# Patient Record
Sex: Female | Born: 1935 | Race: White | Hispanic: No | Marital: Married | Smoking: Never smoker
Health system: Southern US, Community
[De-identification: ages and names within clinical notes are randomized; demographics above are authoritative.]

## PROBLEM LIST (undated history)

## (undated) DIAGNOSIS — G250 Essential tremor: Secondary | ICD-10-CM

## (undated) DIAGNOSIS — L570 Actinic keratosis: Secondary | ICD-10-CM

## (undated) DIAGNOSIS — E785 Hyperlipidemia, unspecified: Secondary | ICD-10-CM

## (undated) DIAGNOSIS — I10 Essential (primary) hypertension: Secondary | ICD-10-CM

## (undated) HISTORY — PX: TUBAL LIGATION: SHX77

## (undated) HISTORY — PX: TONSILLECTOMY: SUR1361

## (undated) HISTORY — PX: APPENDECTOMY: SHX54

## (undated) HISTORY — PX: COLONOSCOPY: SHX174

## (undated) HISTORY — DX: Actinic keratosis: L57.0

## (undated) HISTORY — PX: OTHER SURGICAL HISTORY: SHX169

---

## 2004-11-07 ENCOUNTER — Ambulatory Visit: Payer: Self-pay | Admitting: Family Medicine

## 2005-01-09 ENCOUNTER — Ambulatory Visit: Payer: Self-pay | Admitting: Gastroenterology

## 2005-11-18 ENCOUNTER — Ambulatory Visit: Payer: Self-pay | Admitting: Family Medicine

## 2005-11-26 ENCOUNTER — Other Ambulatory Visit: Payer: Self-pay

## 2005-11-26 ENCOUNTER — Emergency Department: Payer: Self-pay | Admitting: Internal Medicine

## 2006-11-24 ENCOUNTER — Ambulatory Visit: Payer: Self-pay | Admitting: Family Medicine

## 2006-11-27 ENCOUNTER — Ambulatory Visit: Payer: Self-pay | Admitting: Family Medicine

## 2007-12-02 ENCOUNTER — Ambulatory Visit: Payer: Self-pay | Admitting: Family Medicine

## 2009-01-04 ENCOUNTER — Ambulatory Visit: Payer: Self-pay | Admitting: Family Medicine

## 2009-01-11 ENCOUNTER — Ambulatory Visit: Payer: Self-pay | Admitting: Family Medicine

## 2009-11-08 DIAGNOSIS — I10 Essential (primary) hypertension: Secondary | ICD-10-CM | POA: Insufficient documentation

## 2010-01-15 ENCOUNTER — Ambulatory Visit: Payer: Self-pay | Admitting: Family Medicine

## 2011-01-17 ENCOUNTER — Ambulatory Visit: Payer: Self-pay | Admitting: Family Medicine

## 2012-01-20 ENCOUNTER — Ambulatory Visit: Payer: Self-pay | Admitting: Family Medicine

## 2012-04-11 ENCOUNTER — Emergency Department: Payer: Self-pay | Admitting: Emergency Medicine

## 2012-04-12 LAB — CBC WITH DIFFERENTIAL/PLATELET
Basophil #: 0 10*3/uL (ref 0.0–0.1)
Basophil %: 0.3 %
Eosinophil %: 0.1 %
HCT: 37.5 % (ref 35.0–47.0)
HGB: 13 g/dL (ref 12.0–16.0)
Lymphocyte %: 22.5 %
MCH: 32.6 pg (ref 26.0–34.0)
MCHC: 34.6 g/dL (ref 32.0–36.0)
MCV: 94 fL (ref 80–100)
Monocyte #: 1 x10 3/mm — ABNORMAL HIGH (ref 0.2–0.9)
Monocyte %: 10.3 %
Neutrophil #: 6.4 10*3/uL (ref 1.4–6.5)
Platelet: 208 10*3/uL (ref 150–440)
RBC: 3.97 10*6/uL (ref 3.80–5.20)
RDW: 12.8 % (ref 11.5–14.5)
WBC: 9.5 10*3/uL (ref 3.6–11.0)

## 2013-01-20 ENCOUNTER — Ambulatory Visit: Payer: Self-pay | Admitting: Family Medicine

## 2014-03-27 ENCOUNTER — Ambulatory Visit: Payer: Self-pay | Admitting: Family Medicine

## 2020-12-20 ENCOUNTER — Inpatient Hospital Stay (HOSPITAL_COMMUNITY)
Admit: 2020-12-20 | Discharge: 2020-12-20 | Disposition: A | Discharge status: Home / Self Care | Payer: Medicare PPO | Attending: Medical | Admitting: Medical

## 2020-12-20 ENCOUNTER — Inpatient Hospital Stay: Payer: Medicare PPO | Admitting: Anesthesiology

## 2020-12-20 ENCOUNTER — Encounter: Payer: Self-pay | Admitting: Internal Medicine

## 2020-12-20 ENCOUNTER — Other Ambulatory Visit: Payer: Self-pay

## 2020-12-20 ENCOUNTER — Encounter
Admission: EM | Disposition: A | Discharge status: Home Health | Payer: Self-pay | Source: Home / Self Care | Attending: Internal Medicine

## 2020-12-20 ENCOUNTER — Inpatient Hospital Stay
Admission: EM | Admit: 2020-12-20 | Discharge: 2020-12-23 | DRG: 378 | Disposition: A | Discharge status: Home Health | Payer: Medicare PPO | Attending: Internal Medicine | Admitting: Internal Medicine

## 2020-12-20 DIAGNOSIS — E538 Deficiency of other specified B group vitamins: Secondary | ICD-10-CM | POA: Diagnosis present

## 2020-12-20 DIAGNOSIS — T39395A Adverse effect of other nonsteroidal anti-inflammatory drugs [NSAID], initial encounter: Secondary | ICD-10-CM | POA: Diagnosis present

## 2020-12-20 DIAGNOSIS — I4891 Unspecified atrial fibrillation: Secondary | ICD-10-CM | POA: Diagnosis present

## 2020-12-20 DIAGNOSIS — D72828 Other elevated white blood cell count: Secondary | ICD-10-CM | POA: Diagnosis present

## 2020-12-20 DIAGNOSIS — R109 Unspecified abdominal pain: Secondary | ICD-10-CM

## 2020-12-20 DIAGNOSIS — Z79899 Other long term (current) drug therapy: Secondary | ICD-10-CM

## 2020-12-20 DIAGNOSIS — D500 Iron deficiency anemia secondary to blood loss (chronic): Secondary | ICD-10-CM

## 2020-12-20 DIAGNOSIS — D649 Anemia, unspecified: Secondary | ICD-10-CM

## 2020-12-20 DIAGNOSIS — D62 Acute posthemorrhagic anemia: Secondary | ICD-10-CM | POA: Diagnosis present

## 2020-12-20 DIAGNOSIS — G250 Essential tremor: Secondary | ICD-10-CM | POA: Diagnosis present

## 2020-12-20 DIAGNOSIS — I493 Ventricular premature depolarization: Secondary | ICD-10-CM | POA: Diagnosis present

## 2020-12-20 DIAGNOSIS — E860 Dehydration: Secondary | ICD-10-CM | POA: Diagnosis present

## 2020-12-20 DIAGNOSIS — K253 Acute gastric ulcer without hemorrhage or perforation: Secondary | ICD-10-CM | POA: Diagnosis not present

## 2020-12-20 DIAGNOSIS — R531 Weakness: Secondary | ICD-10-CM | POA: Diagnosis not present

## 2020-12-20 DIAGNOSIS — K922 Gastrointestinal hemorrhage, unspecified: Secondary | ICD-10-CM

## 2020-12-20 DIAGNOSIS — I4819 Other persistent atrial fibrillation: Secondary | ICD-10-CM | POA: Diagnosis not present

## 2020-12-20 DIAGNOSIS — I480 Paroxysmal atrial fibrillation: Secondary | ICD-10-CM | POA: Diagnosis present

## 2020-12-20 DIAGNOSIS — Z20822 Contact with and (suspected) exposure to covid-19: Secondary | ICD-10-CM | POA: Diagnosis present

## 2020-12-20 DIAGNOSIS — I10 Essential (primary) hypertension: Secondary | ICD-10-CM

## 2020-12-20 DIAGNOSIS — R7989 Other specified abnormal findings of blood chemistry: Secondary | ICD-10-CM | POA: Diagnosis present

## 2020-12-20 DIAGNOSIS — R791 Abnormal coagulation profile: Secondary | ICD-10-CM

## 2020-12-20 DIAGNOSIS — I482 Chronic atrial fibrillation, unspecified: Secondary | ICD-10-CM | POA: Diagnosis present

## 2020-12-20 DIAGNOSIS — K921 Melena: Secondary | ICD-10-CM | POA: Diagnosis present

## 2020-12-20 DIAGNOSIS — E785 Hyperlipidemia, unspecified: Secondary | ICD-10-CM | POA: Diagnosis present

## 2020-12-20 DIAGNOSIS — K254 Chronic or unspecified gastric ulcer with hemorrhage: Principal | ICD-10-CM | POA: Diagnosis present

## 2020-12-20 DIAGNOSIS — D72829 Elevated white blood cell count, unspecified: Secondary | ICD-10-CM | POA: Diagnosis present

## 2020-12-20 DIAGNOSIS — L8990 Pressure ulcer of unspecified site, unspecified stage: Secondary | ICD-10-CM | POA: Insufficient documentation

## 2020-12-20 DIAGNOSIS — D519 Vitamin B12 deficiency anemia, unspecified: Secondary | ICD-10-CM | POA: Diagnosis not present

## 2020-12-20 HISTORY — DX: Essential (primary) hypertension: I10

## 2020-12-20 HISTORY — DX: Hyperlipidemia, unspecified: E78.5

## 2020-12-20 HISTORY — PX: ESOPHAGOGASTRODUODENOSCOPY: SHX5428

## 2020-12-20 HISTORY — DX: Essential tremor: G25.0

## 2020-12-20 LAB — CBC
HCT: 20.4 % — ABNORMAL LOW (ref 36.0–46.0)
HCT: 23.3 % — ABNORMAL LOW (ref 36.0–46.0)
HCT: 21 % — ABNORMAL LOW (ref 36.0–46.0)
Hemoglobin: 6.7 g/dL — ABNORMAL LOW (ref 12.0–15.0)
Hemoglobin: 7.8 g/dL — ABNORMAL LOW (ref 12.0–15.0)
Hemoglobin: 7.2 g/dL — ABNORMAL LOW (ref 12.0–15.0)
MCH: 33.2 pg (ref 26.0–34.0)
MCH: 31 pg (ref 26.0–34.0)
MCH: 31.4 pg (ref 26.0–34.0)
MCHC: 32.8 g/dL (ref 30.0–36.0)
MCHC: 33.5 g/dL (ref 30.0–36.0)
MCHC: 34.3 g/dL (ref 30.0–36.0)
MCV: 101 fL — ABNORMAL HIGH (ref 80.0–100.0)
MCV: 92.5 fL (ref 80.0–100.0)
MCV: 91.7 fL (ref 80.0–100.0)
Platelets: 216 10*3/uL (ref 150–400)
Platelets: 211 10*3/uL (ref 150–400)
Platelets: 180 10*3/uL (ref 150–400)
RBC: 2.02 MIL/uL — ABNORMAL LOW (ref 3.87–5.11)
RBC: 2.52 MIL/uL — ABNORMAL LOW (ref 3.87–5.11)
RBC: 2.29 MIL/uL — ABNORMAL LOW (ref 3.87–5.11)
RDW: 12.8 % (ref 11.5–15.5)
RDW: 19.6 % — ABNORMAL HIGH (ref 11.5–15.5)
RDW: 19.9 % — ABNORMAL HIGH (ref 11.5–15.5)
WBC: 8.7 10*3/uL (ref 4.0–10.5)
WBC: 8.5 10*3/uL (ref 4.0–10.5)
WBC: 7.5 10*3/uL (ref 4.0–10.5)
nRBC: 0 % (ref 0.0–0.2)
nRBC: 0 % (ref 0.0–0.2)
nRBC: 0 % (ref 0.0–0.2)

## 2020-12-20 LAB — CBC WITH DIFFERENTIAL/PLATELET
Abs Immature Granulocytes: 0.09 10*3/uL — ABNORMAL HIGH (ref 0.00–0.07)
Basophils Absolute: 0.1 10*3/uL (ref 0.0–0.1)
Basophils Relative: 1 %
Eosinophils Absolute: 0.3 10*3/uL (ref 0.0–0.5)
Eosinophils Relative: 2 %
HCT: 22.5 % — ABNORMAL LOW (ref 36.0–46.0)
Hemoglobin: 7.6 g/dL — ABNORMAL LOW (ref 12.0–15.0)
Immature Granulocytes: 1 %
Lymphocytes Relative: 6 %
Lymphs Abs: 0.9 10*3/uL (ref 0.7–4.0)
MCH: 33.8 pg (ref 26.0–34.0)
MCHC: 33.8 g/dL (ref 30.0–36.0)
MCV: 100 fL (ref 80.0–100.0)
Monocytes Absolute: 0.8 10*3/uL (ref 0.1–1.0)
Monocytes Relative: 6 %
Neutro Abs: 12.1 10*3/uL — ABNORMAL HIGH (ref 1.7–7.7)
Neutrophils Relative %: 84 %
Platelets: 250 10*3/uL (ref 150–400)
RBC: 2.25 MIL/uL — ABNORMAL LOW (ref 3.87–5.11)
RDW: 12.8 % (ref 11.5–15.5)
WBC: 14.3 10*3/uL — ABNORMAL HIGH (ref 4.0–10.5)
nRBC: 0 % (ref 0.0–0.2)

## 2020-12-20 LAB — RESP PANEL BY RT-PCR (FLU A&B, COVID) ARPGX2
Influenza A by PCR: NEGATIVE
Influenza B by PCR: NEGATIVE
SARS Coronavirus 2 by RT PCR: NEGATIVE

## 2020-12-20 LAB — ECHOCARDIOGRAM COMPLETE
AR max vel: 1.59 cm2
AV Area VTI: 2.02 cm2
AV Area mean vel: 1.61 cm2
AV Mean grad: 3 mmHg
AV Peak grad: 4.9 mmHg
Ao pk vel: 1.11 m/s
Area-P 1/2: 3.97 cm2
Height: 66 in
S' Lateral: 2.86 cm
Weight: 2515.01 oz

## 2020-12-20 LAB — LACTIC ACID, PLASMA
Lactic Acid, Venous: 3.2 mmol/L (ref 0.5–1.9)
Lactic Acid, Venous: 2.7 mmol/L (ref 0.5–1.9)
Lactic Acid, Venous: 1.1 mmol/L (ref 0.5–1.9)

## 2020-12-20 LAB — RETICULOCYTES
Immature Retic Fract: 27.1 % — ABNORMAL HIGH (ref 2.3–15.9)
RBC.: 2.02 MIL/uL — ABNORMAL LOW (ref 3.87–5.11)
Retic Count, Absolute: 54.9 10*3/uL (ref 19.0–186.0)
Retic Ct Pct: 2.7 % (ref 0.4–3.1)

## 2020-12-20 LAB — VITAMIN B12: Vitamin B-12: 94 pg/mL — ABNORMAL LOW (ref 180–914)

## 2020-12-20 LAB — COMPREHENSIVE METABOLIC PANEL
ALT: 11 U/L (ref 0–44)
AST: 13 U/L — ABNORMAL LOW (ref 15–41)
Albumin: 3.2 g/dL — ABNORMAL LOW (ref 3.5–5.0)
Alkaline Phosphatase: 53 U/L (ref 38–126)
Anion gap: 8 (ref 5–15)
BUN: 65 mg/dL — ABNORMAL HIGH (ref 8–23)
CO2: 20 mmol/L — ABNORMAL LOW (ref 22–32)
Calcium: 9.2 mg/dL (ref 8.9–10.3)
Chloride: 112 mmol/L — ABNORMAL HIGH (ref 98–111)
Creatinine, Ser: 0.84 mg/dL (ref 0.44–1.00)
GFR, Estimated: >60 mL/min (ref >60)
Glucose, Bld: 130 mg/dL — ABNORMAL HIGH (ref 70–99)
Potassium: 3.5 mmol/L (ref 3.5–5.1)
Sodium: 140 mmol/L (ref 135–145)
Total Bilirubin: 0.7 mg/dL (ref 0.3–1.2)
Total Protein: 5.8 g/dL — ABNORMAL LOW (ref 6.5–8.1)

## 2020-12-20 LAB — PROTIME-INR
INR: 2 — ABNORMAL HIGH (ref 0.8–1.2)
INR: 1.2 (ref 0.8–1.2)
Prothrombin Time: 21.8 seconds — ABNORMAL HIGH (ref 11.4–15.2)
Prothrombin Time: 15 seconds (ref 11.4–15.2)

## 2020-12-20 LAB — PREPARE RBC (CROSSMATCH)

## 2020-12-20 LAB — IRON AND TIBC
Iron: 112 ug/dL (ref 28–170)
Saturation Ratios: 49 % — ABNORMAL HIGH (ref 10.4–31.8)
TIBC: 230 ug/dL — ABNORMAL LOW (ref 250–450)
UIBC: 118 ug/dL

## 2020-12-20 LAB — TROPONIN I (HIGH SENSITIVITY): Troponin I (High Sensitivity): 9 ng/L (ref <18)

## 2020-12-20 LAB — FERRITIN: Ferritin: 52 ng/mL (ref 11–307)

## 2020-12-20 LAB — APTT: aPTT: 31 seconds (ref 24–36)

## 2020-12-20 LAB — ABO/RH: ABO/RH(D): O NEG

## 2020-12-20 LAB — BRAIN NATRIURETIC PEPTIDE: B Natriuretic Peptide: 227.9 pg/mL — ABNORMAL HIGH (ref 0.0–100.0)

## 2020-12-20 LAB — FOLATE: Folate: 8.9 ng/mL (ref >5.9)

## 2020-12-20 SURGERY — EGD (ESOPHAGOGASTRODUODENOSCOPY)
Anesthesia: General

## 2020-12-20 MED ORDER — PANTOPRAZOLE SODIUM 40 MG IV SOLR
40.0000 mg | Freq: Two times a day (BID) | INTRAVENOUS | Status: DC
  Administered 2020-12-20 – 2020-12-23 (×6): 40 mg via INTRAVENOUS
  Filled 2020-12-20 (×6): qty 40

## 2020-12-20 MED ORDER — SODIUM CHLORIDE 0.9 % IV BOLUS
1000.0000 mL | Freq: Once | INTRAVENOUS | Status: AC
  Administered 2020-12-20: 1000 mL via INTRAVENOUS

## 2020-12-20 MED ORDER — PROPOFOL 500 MG/50ML IV EMUL
INTRAVENOUS | Status: DC | PRN
  Administered 2020-12-20: 50 ug/kg/min via INTRAVENOUS

## 2020-12-20 MED ORDER — EPINEPHRINE 1 MG/10ML IJ SOSY
PREFILLED_SYRINGE | INTRAMUSCULAR | Status: DC | PRN
  Administered 2020-12-20: 0.1 mg via INTRAVENOUS

## 2020-12-20 MED ORDER — PROPRANOLOL HCL 40 MG PO TABS
40.0000 mg | ORAL_TABLET | Freq: Three times a day (TID) | ORAL | Status: DC
  Administered 2020-12-20 – 2020-12-23 (×6): 40 mg via ORAL
  Filled 2020-12-20 (×11): qty 1

## 2020-12-20 MED ORDER — TRAMADOL HCL 50 MG PO TABS
50.0000 mg | ORAL_TABLET | ORAL | Status: DC | PRN
  Administered 2020-12-20 – 2020-12-22 (×5): 50 mg via ORAL
  Filled 2020-12-20 (×5): qty 1

## 2020-12-20 MED ORDER — SODIUM CHLORIDE 0.9% IV SOLUTION: Freq: Once | INTRAVENOUS | Status: AC

## 2020-12-20 MED ORDER — HYDRALAZINE HCL 20 MG/ML IJ SOLN: 5.0000 mg | INTRAMUSCULAR | Status: DC | PRN

## 2020-12-20 MED ORDER — ATORVASTATIN CALCIUM 10 MG PO TABS
10.0000 mg | ORAL_TABLET | Freq: Every day | ORAL | Status: DC
  Administered 2020-12-20 – 2020-12-22 (×3): 10 mg via ORAL
  Filled 2020-12-20 (×3): qty 1

## 2020-12-20 MED ORDER — OCTREOTIDE LOAD VIA INFUSION
50.0000 ug | Freq: Once | INTRAVENOUS | Status: AC
  Administered 2020-12-20: 50 ug via INTRAVENOUS
  Filled 2020-12-20: qty 25

## 2020-12-20 MED ORDER — SODIUM CHLORIDE 0.9 % IV SOLN
50.0000 ug/h | INTRAVENOUS | Status: DC
  Administered 2020-12-20: 50 ug/h via INTRAVENOUS
  Filled 2020-12-20 (×3): qty 1

## 2020-12-20 MED ORDER — PROPOFOL 10 MG/ML IV BOLUS
INTRAVENOUS | Status: DC | PRN
  Administered 2020-12-20: 10 mg via INTRAVENOUS
  Administered 2020-12-20: 30 mg via INTRAVENOUS

## 2020-12-20 MED ORDER — MORPHINE SULFATE (PF) 2 MG/ML IV SOLN: 0.5000 mg | INTRAVENOUS | Status: DC | PRN

## 2020-12-20 MED ORDER — ONDANSETRON HCL 4 MG/2ML IJ SOLN: 4.0000 mg | Freq: Three times a day (TID) | INTRAMUSCULAR | Status: DC | PRN

## 2020-12-20 MED ORDER — ONDANSETRON HCL 4 MG/2ML IJ SOLN
4.0000 mg | Freq: Once | INTRAMUSCULAR | Status: AC
  Administered 2020-12-20: 4 mg via INTRAVENOUS
  Filled 2020-12-20: qty 2

## 2020-12-20 MED ORDER — ACETAMINOPHEN 325 MG PO TABS: 650.0000 mg | ORAL_TABLET | Freq: Four times a day (QID) | ORAL | Status: DC | PRN

## 2020-12-20 MED ORDER — SODIUM CHLORIDE 0.9 % IV SOLN: INTRAVENOUS | Status: DC

## 2020-12-20 MED ORDER — SODIUM CHLORIDE 0.9 % IV SOLN
8.0000 mg/h | INTRAVENOUS | Status: DC
  Administered 2020-12-20: 8 mg/h via INTRAVENOUS
  Filled 2020-12-20: qty 80

## 2020-12-20 MED ORDER — SODIUM CHLORIDE 0.9 % IV SOLN
80.0000 mg | Freq: Once | INTRAVENOUS | Status: AC
  Administered 2020-12-20: 06:00:00 80 mg via INTRAVENOUS
  Filled 2020-12-20: qty 80

## 2020-12-20 NOTE — Anesthesia Procedure Notes (Signed)
Date/Time: 12/20/2020 3:23 PM Performed by: Junious Silk, CRNA Pre-anesthesia Checklist: Patient identified, Emergency Drugs available, Suction available, Patient being monitored and Timeout performed Oxygen Delivery Method: Nasal cannula

## 2020-12-20 NOTE — Anesthesia Postprocedure Evaluation (Signed)
Anesthesia Post Note  Patient: Lauren Wheeler [facility]  Procedure(s) Performed: ESOPHAGOGASTRODUODENOSCOPY (EGD) (N/A )  Patient location during evaluation: Endoscopy Anesthesia Type: General Level of consciousness: awake and alert Pain management: pain level controlled Vital Signs Assessment: post-procedure vital signs reviewed and stable Respiratory status: spontaneous breathing, nonlabored ventilation, respiratory function stable and patient connected to nasal cannula oxygen Cardiovascular status: blood pressure returned to baseline and stable Postop Assessment: no apparent nausea or vomiting Anesthetic complications: no   No complications documented.   Last Vitals:  Vitals:   12/20/20 1624 12/20/20 1702  BP: 133/74 132/80  Pulse: 67 83  Resp: 11 16  Temp:  37 C  SpO2: 95% 97%    Last Pain:  Vitals:   12/20/20 1737  TempSrc:   PainSc: 5                  Lenard Simmer

## 2020-12-20 NOTE — Anesthesia Preprocedure Evaluation (Addendum)
Anesthesia Evaluation  Patient identified by MRN, date of birth, ID band Patient awake    Reviewed: Allergy & Precautions, H&P , NPO status , Patient's Chart, lab work & pertinent test results  Airway Mallampati: II  TM Distance: >3 FB Neck ROM: Full    Dental  (+) Poor Dentition   Pulmonary neg pulmonary ROS,    Pulmonary exam normal        Cardiovascular hypertension, negative cardio ROS Normal cardiovascular exam     Neuro/Psych negative neurological ROS  negative psych ROS   GI/Hepatic negative GI ROS, Neg liver ROS, Bowel prep,  Endo/Other  negative endocrine ROS  Renal/GU negative Renal ROS  negative genitourinary   Musculoskeletal negative musculoskeletal ROS (+)   Abdominal   Peds negative pediatric ROS (+)  Hematology negative hematology ROS (+) anemia ,   Anesthesia Other Findings GI Bleed  . H/O colonoscopy 2006 negative  . Mixed hyperlipidemia  . Hypertension, essential  . Low back pain  . Osteoarthrosis  . Essential tremor  . Vitamin D deficiency  . Radiculopathy, lumbar region, right  . Biceps tendinitis of right upper extremity  . Sinus arrhythmia  . Proteinuria     Reproductive/Obstetrics negative OB ROS                            Anesthesia Physical Anesthesia Plan  ASA: II  Anesthesia Plan: General   Post-op Pain Management:    Induction: Intravenous  PONV Risk Score and Plan: 2 and Propofol infusion and TIVA  Airway Management Planned: Natural Airway and Nasal Cannula  Additional Equipment:   Intra-op Plan:   Post-operative Plan:   Informed Consent: I have reviewed the patients History and Physical, chart, labs and discussed the procedure including the risks, benefits and alternatives for the proposed anesthesia with the patient or authorized representative who has indicated his/her understanding and acceptance.       Plan Discussed with:  CRNA, Anesthesiologist and Surgeon  Anesthesia Plan Comments:         Anesthesia Quick Evaluation

## 2020-12-20 NOTE — ED Triage Notes (Signed)
Pt BIBA via [facility] from home c/o weakness. EMS reports that pt lives at home alone and noticed increasing weakness over the past couple of days. Pt states she was unable to get out of bed this morning. EMS reports that pt was able to make it to bathroom and noticed dark tarry stools. Pt has hx of high cholesterol, HTN, and tremors at baseline.

## 2020-12-20 NOTE — Progress Notes (Signed)
*  PRELIMINARY RESULTS* Echocardiogram 2D Echocardiogram has been performed.  Lauren Wheeler 12/20/2020, 2:19 PM

## 2020-12-20 NOTE — Op Note (Signed)
[facility] Patient Name: Lauren Wheeler Procedure Date: 12/20/2020 3:17 PM MRN: [id number] Account #: [id number] Date of Birth: 1927-10-27 Admit Type: Inpatient Age: 87 Room: [facility] ENDO ROOM 3 Gender: Female Note Status: Finalized Procedure:             Upper GI endoscopy Indications:           Melena Providers:             Andrey Farmer MD, MD Referring MD:          Claiborne Billings, MD (Referring MD) Medicines:             Monitored Anesthesia Care Complications:         No immediate complications. Estimated blood loss:                         Minimal. Procedure:             Pre-Anesthesia Assessment:                        - Prior to the procedure, a History and Physical was                         performed, and patient medications and allergies were                         reviewed. The patient is competent. The risks and                         benefits of the procedure and the sedation options and                         risks were discussed with the patient. All questions                         were answered and informed consent was obtained.                         Patient identification and proposed procedure were                         verified by the physician, the nurse, the anesthetist                         and the technician in the endoscopy suite. Mental                         Status Examination: alert and oriented. Airway                         Examination: normal oropharyngeal airway and neck                         mobility. Respiratory Examination: clear to                         auscultation. CV Examination: normal. Prophylactic                         Antibiotics: The  patient does not require prophylactic                         antibiotics. Prior Anticoagulants: The patient has                         taken no previous anticoagulant or antiplatelet                         agents. ASA Grade Assessment: III - A patient with                          severe systemic disease. After reviewing the risks and                         benefits, the patient was deemed in satisfactory                         condition to undergo the procedure. The anesthesia                         plan was to use monitored anesthesia care (MAC).                         Immediately prior to administration of medications,                         the patient was re-assessed for adequacy to receive                         sedatives. The heart rate, respiratory rate, oxygen                         saturations, blood pressure, adequacy of pulmonary                         ventilation, and response to care were monitored                         throughout the procedure. The physical status of the                         patient was re-assessed after the procedure.                        After obtaining informed consent, the endoscope was                         passed under direct vision. Throughout the procedure,                         the patient's blood pressure, pulse, and oxygen                         saturations were monitored continuously. The Endoscope                         was introduced through the mouth, and advanced to the  second part of duodenum. The upper GI endoscopy was                         accomplished without difficulty. The patient tolerated                         the procedure well. Findings:      The examined esophagus was normal.      One non-bleeding cratered gastric ulcer with a visible vessel was found       on the lesser curvature of the gastric antrum. The lesion was 10 mm in       largest dimension. Area was successfully injected with 4 mL of a       1:10,000 solution of epinephrine for hemostasis. Estimated blood loss:       none. Two clips were intially placed for closure of ulcer but this was       unsuccessful. Coagulation for hemostasis using bipolar probe was       successful.  Estimated blood loss: none.      Clotted blood was found in the gastric fundus. Attempted to remove       without success. Regardless, source of bleeding was antral ulcer with       visible vessel.      The examined duodenum was normal. Impression:            - Normal esophagus.                        - Non-bleeding gastric ulcer with a visible vessel.                         Injected. Treated with bipolar cautery.                        - Clotted blood in the gastric fundus.                        - Normal examined duodenum.                        - No specimens collected. Recommendation:        - Return patient to hospital ward for ongoing care.                        - Clear liquid diet.                        - Use Protonix (pantoprazole) 40 mg IV BID daily for 3                         days and then switch to PO BID for 12 weeks                        - No ibuprofen, naproxen, or other non-steroidal                         anti-inflammatory drugs.                        - Repeat upper endoscopy in 8-12 weeks to check  healing.                        - Return to GI clinic at appointment to be scheduled. Procedure Code(s):     --- Professional ---                        [phone], Esophagogastroduodenoscopy, flexible,                         transoral; with control of bleeding, any method Diagnosis Code(s):     --- Professional ---                        K25.4, Chronic or unspecified gastric ulcer with                         hemorrhage                        K92.2, Gastrointestinal hemorrhage, unspecified                        K92.1, Melena (includes Hematochezia) CPT copyright 2019 American Medical Association. All rights reserved. The codes documented in this report are preliminary and upon coder review may  be revised to meet current compliance requirements. Andrey Farmer MD, MD 12/20/2020 3:58:50 PM Number of Addenda: 0 Note Initiated On: 12/20/2020 3:17  PM Estimated Blood Loss:  Estimated blood loss was minimal.      [facility]

## 2020-12-20 NOTE — H&P (Signed)
History and Physical    [name] NOT:[id number] DOB: 1927/03/13 DOA: 12/20/2020  Referring MD/NP/PA:   PCP: Lupita Dawn, MD   Patient coming from:  The patient is coming from home.  At baseline, pt is independent for most of ADL.        Chief Complaint: tarrry stool  HPI: Lauren Wheeler is a 87 y.o. female with medical history significant of HTN, HLD, irregular heartbeat, essential tremor, who presents with tarry stool.  Patient states that she tarry stool in the past 3 days intermittently.  She has nausea, no vomiting.  She has abdominal pain, which is located in the left middle quadrant, constant, mild, aching, nonradiating.  No fever or chills.  Patient does not have chest pain, cough, shortness breath.  No symptoms of UTI or unilateral weakness.  Patient states that she has history of irregular heartbeat and was told to have premature beat, but no history of A. fib.  ED Course: pt was found to have hemoglobin 7.6 (13.0 on 09/10/2020), WBC 14.3, lactic acid 3.2, 2.7, INR 2.0, troponin level 9.0, negative Covid PCR, creatinine 0.84, BUN 65, temperature normal, blood pressure 118/71, heart rate 98-114, RR 18, oxygen saturation 98% on room air.  Patient is admitted to MedSurg bed as inpatient  Review of Systems:   General: no fevers, chills, no body weight gain, has fatigue HEENT: no blurry vision, hearing changes or sore throat Respiratory: no dyspnea, coughing, wheezing CV: no chest pain, no palpitations GI: Has nausea, abdominal pain, tarry stool, no diarrhea, constipation GU: no dysuria, burning on urination, increased urinary frequency, hematuria  Ext: no leg edema Neuro: no unilateral weakness, numbness, or tingling, no vision change or hearing loss Skin: no rash, no skin tear. MSK: No muscle spasm, no deformity, no limitation of range of movement in spin Heme: No easy bruising.  Travel history: No recent long distant travel.  Allergy: Not on File  Past Medical  History:  Diagnosis Date  . Essential tremor   . HLD (hyperlipidemia)   . HTN (hypertension)     Past Surgical History:  Procedure Laterality Date  . dental procedure      Social History:  reports that she has never smoked. She has never used smokeless tobacco. She reports that she does not drink alcohol and does not use drugs.  Family History:  Family History  Problem Relation Age of Onset  . Parkinson's disease Mother   . Stroke Father      Prior to Admission medications   Not on File    Physical Exam: Vitals:   12/20/20 0615 12/20/20 0700 12/20/20 0830 12/20/20 0939  BP:  128/71 113/88 118/66  Pulse: 98 90 79 93  Resp: 13 11 14 16   Temp:    98.2 F (36.8 C)  TempSrc:    Oral  SpO2: 100% 100% 96% 100%  Weight:      Height:       General: Not in acute distress.  HEENT:       Eyes: PERRL, EOMI, no scleral icterus.       ENT: No discharge from the ears and nose, no pharynx injection, no tonsillar enlargement.        Neck: No JVD, no bruit, no mass felt. Heme: No neck lymph node enlargement. Cardiac: S1/S2, RRR, No murmurs, No gallops or rubs. Respiratory: No rales, wheezing, rhonchi or rubs. GI: Soft, nondistended, has tenderness in left middle quadrant,  no rebound pain, no organomegaly, BS present.  GU: No hematuria Ext: No pitting leg edema bilaterally. 1+DP/PT pulse bilaterally. Musculoskeletal: No joint deformities, No joint redness or warmth, no limitation of ROM in spin. Skin: No rashes.  Neuro: Alert, oriented X3, cranial nerves II-XII grossly intact, moves all extremities normally. Psych: Patient is not psychotic, no suicidal or hemocidal ideation.  Labs on Admission: I have personally reviewed following labs and imaging studies  CBC: Recent Labs  Lab 12/20/20 0504  WBC 14.3*  NEUTROABS 12.1*  HGB 7.6*  HCT 22.5*  MCV 100.0  PLT 250   Basic Metabolic Panel: Recent Labs  Lab 12/20/20 0504  NA 140  K 3.5  CL 112*  CO2 20*  GLUCOSE 130*   BUN 65*  CREATININE 0.84  CALCIUM 9.2   GFR: Estimated Creatinine Clearance: 44.1 mL/min (by C-G formula based on SCr of 0.84 mg/dL). Liver Function Tests: Recent Labs  Lab 12/20/20 0504  AST 13*  ALT 11  ALKPHOS 53  BILITOT 0.7  PROT 5.8*  ALBUMIN 3.2*   No results for input(s): LIPASE, AMYLASE in the last 168 hours. No results for input(s): AMMONIA in the last 168 hours. Coagulation Profile: Recent Labs  Lab 12/20/20 0525  INR 2.0*   Cardiac Enzymes: No results for input(s): CKTOTAL, CKMB, CKMBINDEX, TROPONINI in the last 168 hours. BNP (last 3 results) No results for input(s): PROBNP in the last 8760 hours. HbA1C: No results for input(s): HGBA1C in the last 72 hours. CBG: No results for input(s): GLUCAP in the last 168 hours. Lipid Profile: No results for input(s): CHOL, HDL, LDLCALC, TRIG, CHOLHDL, LDLDIRECT in the last 72 hours. Thyroid Function Tests: No results for input(s): TSH, T4TOTAL, FREET4, T3FREE, THYROIDAB in the last 72 hours. Anemia Panel: Recent Labs    12/20/20 0709  FOLATE 8.9  FERRITIN 52  TIBC 230*  IRON 112   Urine analysis: No results found for: COLORURINE, APPEARANCEUR, LABSPEC, PHURINE, GLUCOSEU, HGBUR, BILIRUBINUR, KETONESUR, PROTEINUR, UROBILINOGEN, NITRITE, LEUKOCYTESUR Sepsis Labs: @LABRCNTIP (procalcitonin:4,lacticidven:4) ) Recent Results (from the past 240 hour(s))  Resp Panel by RT-PCR (Flu A&B, Covid) Nasopharyngeal Swab     Status: None   Collection Time: 12/20/20  5:25 AM   Specimen: Nasopharyngeal Swab; Nasopharyngeal(NP) swabs in vial transport medium  Result Value Ref Range Status   SARS Coronavirus 2 by RT PCR NEGATIVE NEGATIVE Final    Comment: (NOTE) SARS-CoV-2 target nucleic acids are NOT DETECTED.  The SARS-CoV-2 RNA is generally detectable in upper respiratory specimens during the acute phase of infection. The lowest concentration of SARS-CoV-2 viral copies this assay can detect is 138 copies/mL. A negative  result does not preclude SARS-Cov-2 infection and should not be used as the sole basis for treatment or other patient management decisions. A negative result may occur with  improper specimen collection/handling, submission of specimen other than nasopharyngeal swab, presence of viral mutation(s) within the areas targeted by this assay, and inadequate number of viral copies(<138 copies/mL). A negative result must be combined with clinical observations, patient history, and epidemiological information. The expected result is Negative.  Fact Sheet for Patients:  BloggerCourse.comhttps://www.fda.gov/media/152166/download  Fact Sheet for Healthcare Providers:  SeriousBroker.ithttps://www.fda.gov/media/152162/download  This test is no t yet approved or cleared by the [place] FDA and  has been authorized for detection and/or diagnosis of SARS-CoV-2 by FDA under an Emergency Use Authorization (EUA). This EUA will remain  in effect (meaning this test can be used) for the duration of the COVID-19 declaration under Section 564(b)(1) of the Act, 21 U.S.C.section 360bbb-3(b)(1), unless the  authorization is terminated  or revoked sooner.       Influenza A by PCR NEGATIVE NEGATIVE Final   Influenza B by PCR NEGATIVE NEGATIVE Final    Comment: (NOTE) The Xpert Xpress SARS-CoV-2/FLU/RSV plus assay is intended as an aid in the diagnosis of influenza from Nasopharyngeal swab specimens and should not be used as a sole basis for treatment. Nasal washings and aspirates are unacceptable for Xpert Xpress SARS-CoV-2/FLU/RSV testing.  Fact Sheet for Patients: BloggerCourse.com  Fact Sheet for Healthcare Providers: SeriousBroker.it  This test is not yet approved or cleared by the [place] FDA and has been authorized for detection and/or diagnosis of SARS-CoV-2 by FDA under an Emergency Use Authorization (EUA). This EUA will remain in effect (meaning this test can be used)  for the duration of the COVID-19 declaration under Section 564(b)(1) of the Act, 21 U.S.C. section 360bbb-3(b)(1), unless the authorization is terminated or revoked.  Performed at [facility], [address] [place], [place] [address]      Radiological Exams on Admission: No results found.   EKG: I have personally reviewed.  Atrial fibrillation, QTC 475, low voltage, PVC, LAD, poor R wave progression Assessment/Plan Principal Problem:   GI bleeding Active Problems:   HTN (hypertension)   HLD (hyperlipidemia)   Essential tremor   Unspecified atrial fibrillation (HCC)   Elevated INR   Leukocytosis   Elevated lactic acid level   Blood loss anemia   Abdominal pain   GI bleeding and blood loss anemia: Hgb 13.0 -->7.6 -->6.7. Dr. Mia Creek is consulted. EGD was performed, which showed "non-bleeding gastric ulcer with a visible vessel. Injected. Treated with bipolar cautery"  - will admitted to progressive bed as inpatient - transfuse 2 units of blood now - IVF: 1L NS bolus, then at 75 mL/hr - Start IV pantoprazole gtt --.switched to 40 mg bid IV after EGD - started Octreotide gtt -->d/c'ed - Zofran IV for nausea - Avoid NSAIDs and SQ heparin - Maintain IV access (2 large bore IVs if possible). - Monitor closely and follow q6h cbc, transfuse as necessary, if Hgb<7.0 - LaB: INR, PTT and type screen  HTN (hypertension): Blood pressure 118/71 -IV hydralazine as needed -Hold amlodipine and HCTZ since patient may need Cardizem for A. Fib -Continue propranolol which is for essential tremor  HLD (hyperlipidemia) -Lipitor  Essential tremor -Propranolol  Unspecified atrial fibrillation (HCC): Dr. Mariah Milling for cardiology is consulted -Continue propranolol -If heart rate is not controlled, may switch to Cardizem -Check TSH  Elevated INR: Initial INR 2.0, repeated INR 1.2 -No treatment needed  Leukocytosis: WBC 14.3. No source of infection identified, likely  reactive -Follow-up with CBC  Elevated lactic acid level: Lactic acid 3.2, 2.7, possibly due to dehydration -Hold HCTZ -IV fluid as above  Abdominal pain: Possibly due to gastric ulcer -On Protonix         DVT ppx: SCD Code Status: Full code Family Communication:  Yes, patient's daughter at bed side Disposition Plan:  Anticipate discharge back to previous environment Consults called: Dr. Mia Creek Admission status and Level of care: :    Med-surg bed for obs  Status is: Inpatient  Remains inpatient appropriate because:Inpatient level of care appropriate due to severity of illness   Dispo: The patient is from: Home              Anticipated d/c is to: Home              Anticipated d/c date is: 2 days  Patient currently is not medically stable to d/c.   Difficult to place patient No           Date of Service 12/20/2020    Lorretta Harp [facility]   If 7PM-7AM, please contact night-coverage [email or web address] 12/20/2020, 9:55 AM

## 2020-12-20 NOTE — Care Plan (Signed)
EGD showed gastric ulcer with visible vessel s/p epi injection and bipolar cautery.  - IV PPI BID for total of three days - clears tonight - expect small amount of melena tonight - Daily cbc's - will continue to follow  Merlyn Lot MD, MPH [facility] GI

## 2020-12-20 NOTE — Consult Note (Signed)
GI Inpatient Consult Note  Reason for Consult: Melena/UGI Bleed   Attending Requesting Consult: Dr. Lorretta Harp, MD  History of Present Illness: Lauren Wheeler is a 87 y.o. female seen for evaluation of melena 2/2 likely upper GI bleed at the request of Dr. Clyde Lundborg. Pt has a PMH of HTN, HLD, OA, Vit D deficiency, and essential tremors. She presented to the [facility] ED via EMS this morning for chief complaint of melena, nausea, and generalized weakness. She reports over the past four days she has been experiencing progressive weakness and nausea. She reports she went to bed last night feeling normal and then was awoken up from sleep around 0300 this morning with urge to have a BM. She made it to the toilet and reports she saw a bunch of dark, tarry stool. She struggled to get off the toilet due to weakness and lightheadedness, but eventually made it back to her bed and called her daughter who ended up calling EMS. Patient reports she had a total of three episodes of melena and her last episode was while she was being loaded up on the ambulance. Upon arrival to the ED, patient had BP 95/77 and otherwise normal vital signs. Blood work was significant for hemoglobin 7.6 which was much lower than her baseline of 13 back in November 2021. She had leukocytosis 14.3K, lactic acid 3.2-->2.7, INR 2.0, elevated troponin 9.0, BUN 65. Covid PCR was negative. Digital rectal exam by ED physician showed dark, tarry stool which was guaiac positive. She was started on Protonix and Octreotide infusions.  Patient seen and examined this morning resting comfortably in hospital bed. Her daughter is also present with her. Patient reports she has had ongoing nausea without emesis over the past 4 days and feeling weaker than normal. No melanotic stool since her admission. Recent hemoglobin was checked at 1000 and was found to be 6.7. She reports some mild LUQ and epigastric discomfort, but says it is not severe enough to be a pain. She denies  any history of GI bleeding. She reports a long history of arthritis and reports she takes Ibuprofen 1600 mg daily and has been doing this for years. No previous endoscopy. She had colonoscopy in 2006 which was reportedly normal per chart review. She denies any reflux or pyrosis symptoms. She denies esophageal dysphagia, odynophagia, early satiety, anorexia, or unintentional weight loss.    Last Colonoscopy: 2006 - negative Last Endoscopy: N/A   Past Medical History:  Past Medical History:  Diagnosis Date  . Essential tremor   . HLD (hyperlipidemia)   . HTN (hypertension)     Problem List: Patient Active Problem List   Diagnosis Date Noted  . GI bleeding 12/20/2020  . Unspecified atrial fibrillation (HCC) 12/20/2020  . Elevated INR 12/20/2020  . Leukocytosis 12/20/2020  . Elevated lactic acid level 12/20/2020  . Blood loss anemia 12/20/2020  . Abdominal pain 12/20/2020  . HTN (hypertension)   . HLD (hyperlipidemia)   . Essential tremor     Past Surgical History: Past Surgical History:  Procedure Laterality Date  . dental procedure      Allergies: Not on File  Home Medications: Medications Prior to Admission  Medication Sig Dispense Refill Last Dose  . amLODipine (NORVASC) 5 MG tablet Take 5 mg by mouth daily.     Marland Kitchen atorvastatin (LIPITOR) 10 MG tablet Take 10 mg by mouth daily.     Marland Kitchen DERMOTIC 0.01 % OIL Place in ear(s).     . hydrochlorothiazide (HYDRODIURIL)  25 MG tablet Take 25 mg by mouth daily.     . IBU 800 MG tablet Take 800 mg by mouth every 8 (eight) hours as needed for pain.     Marland Kitchen propranolol (INDERAL) 40 MG tablet Take 40 mg by mouth 3 (three) times daily.     . traMADol (ULTRAM) 50 MG tablet Take 50 mg by mouth 4 (four) times daily as needed for pain.      Home medication reconciliation was completed with the patient.   Scheduled Inpatient Medications:   . sodium chloride   Intravenous Once    Continuous Inpatient Infusions:   . sodium chloride 75 mL/hr  at 12/20/20 0936  . pantoprozole (PROTONIX) infusion 8 mg/hr (12/20/20 0657)    PRN Inpatient Medications:  acetaminophen, hydrALAZINE, morphine injection, ondansetron (ZOFRAN) IV  Family History: family history includes Parkinson's disease in her mother; Stroke in her father.  The patient's family history is negative for inflammatory bowel disorders, GI malignancy, or solid organ transplantation.  Social History:   reports that she has never smoked. She has never used smokeless tobacco. She reports that she does not drink alcohol and does not use drugs. The patient denies ETOH, tobacco, or drug use.   Review of Systems: Constitutional: Weight is stable.  Eyes: No changes in vision. ENT: No oral lesions, sore throat.  GI: see HPI.  Heme/Lymph: No easy bruising.  CV: No chest pain.  GU: No hematuria.  Integumentary: No rashes.  Neuro: No headaches.  Psych: No depression/anxiety.  Endocrine: No heat/cold intolerance.  Allergic/Immunologic: No urticaria.  Resp: No cough, SOB.  Musculoskeletal: No joint swelling.    Physical Examination: BP 118/66 (BP Location: Left Arm)   Pulse 93   Temp 98.2 F (36.8 C) (Oral)   Resp 16   Ht 5\' 6"  (1.676 m)   Wt 71.3 kg   SpO2 100%   BMI 25.37 kg/m  Gen: NAD, alert and oriented x 4 HEENT: PEERLA, EOMI, Neck: supple, no JVD or thyromegaly Chest: CTA bilaterally, no wheezes, crackles, or other adventitious sounds CV: RRR, no m/g/c/r Abd: soft, NT, ND, +BS in all four quadrants; no HSM, guarding, ridigity, or rebound tenderness Ext: no edema, well perfused with 2+ pulses, Skin: no rash or lesions noted Lymph: no LAD  Data: Lab Results  Component Value Date   WBC 8.7 12/20/2020   HGB 6.7 (L) 12/20/2020   HCT 20.4 (L) 12/20/2020   MCV 101.0 (H) 12/20/2020   PLT 216 12/20/2020   Recent Labs  Lab 12/20/20 0504 12/20/20 0958  HGB 7.6* 6.7*   Lab Results  Component Value Date   NA 140 12/20/2020   K 3.5 12/20/2020   CL 112  (H) 12/20/2020   CO2 20 (L) 12/20/2020   BUN 65 (H) 12/20/2020   CREATININE 0.84 12/20/2020   Lab Results  Component Value Date   ALT 11 12/20/2020   AST 13 (L) 12/20/2020   ALKPHOS 53 12/20/2020   BILITOT 0.7 12/20/2020   Recent Labs  Lab 12/20/20 0958  APTT 31  INR 1.2   Assessment/Plan:  87 y/o Caucasian female with a PMH of HTN, HLD, OA, Vit D deficiency, and essential tremors admitted for melena, nausea, and generalized weakness  1. Melena 2/2 UGI bleed 2. Acute blood loss anemia 3. Overuse of NSAIDs  COVID-19 NEGATIVE  -History and clinical presentation is most consistent with an UGI bleed, peptic ulcer disease most likely. DDx also includes gastritis +/- H pylori, duodenitis, erosive esophagitis,  Dieulafoy's lesion, varices less likely, AVMs, malignancy, etc -Evidence of acute blood loss anemia with hemoglobin 6.7, down from baseline of 13.0 in November 2021 -Give 1 unit pRBCs -Continue Protonix gtt for gastric protection -Antiemetics as needed -Repeat INR and lactic acid -Avoid NSAIDs -Advise EGD this afternoon with Dr. Norma Fredrickson for endoscopic evaluation and potential hemostasis. Discussed procedure details and indications with both patient and daughter. She consents to proceed. -See procedure note for findings and further recommendations   I reviewed the risks (including bleeding, perforation, infection, anesthesia complications, cardiac/respiratory complications), benefits and alternatives of EGD. Patient consents to proceed.    Thank you for the consult. Please call with questions or concerns.  Mickle Mallory [facility] [phone] (Cell)

## 2020-12-20 NOTE — ED Notes (Signed)
Pt in NAD at this time. Pt does report some discomfort in abdomen at this time.   Pt cleaned and placed in new depends.

## 2020-12-20 NOTE — Consult Note (Signed)
Cardiology Consultation:   Patient ID: [name] MRN: [id number]; DOB: Sep 29, 1927  Admit date: 12/20/2020 Date of Consult: 12/20/2020  PCP:  Claiborne Billings, MD   [place] Cardiologist:  New Advanced Practice Provider:  No care team member to display Electrophysiologist:  None  :[id number]}  Patient Profile:   Lauren Wheeler is a 87 y.o. female with a hx of HTN, HLD, irregular heart rhythm/PVCs, essential tremor who is being seen today for the evaluation of Afib at the request of Dr. Blaine Hamper.  History of Present Illness:   [name] has no prior cardiac history. She ahas been told heart rate is abnormal and has PVCs, no history of afib. No history of stroke, thyroid issues, PVD or diabetes. She takes propranolol for essential tremors. PTA she was taking amlodipine and HCTZ for blood pressure. Denies smoking, alcohol, drug history. She lives by herself and uses a walker around the house, can perform all ADLs.   She presented to the ER 12/20/20 with tarry stools that started early this norning. Patient also reported left sided abdominal pain. She had nasuea for the last 3-4 nights but no vomiting. No fever or chills. No chest pain, sob, palpitations. She has had shortness of breath and weakness. No lower leg edema, orthopnea, or pnd.   In the ED Hgb was 7.6, WBC 14.3, lactic acid 3.2>2.7. Troponin 9. COVID negative. Creatinine 0.84, BUN 65. BP 118/71, pulse labile, O2 normal. EKG shows afib with PVCs with a heart rate of 104bpm  Patient was admitted and GI consulted.    Past Medical History:  Diagnosis Date  . Essential tremor   . HLD (hyperlipidemia)   . HTN (hypertension)     Past Surgical History:  Procedure Laterality Date  . dental procedure       Home Medications:  Prior to Admission medications   Medication Sig Start Date End Date Taking? Authorizing Provider  amLODipine (NORVASC) 5 MG tablet Take 5 mg by mouth daily. 12/11/20   [provider]  atorvastatin (LIPITOR) 10 MG tablet Take 10 mg by mouth daily. 12/11/20   [provider]  DERMOTIC 0.01 % OIL Place in ear(s). 12/11/20   [provider]  hydrochlorothiazide (HYDRODIURIL) 25 MG tablet Take 25 mg by mouth daily. 09/10/20   [provider]  IBU 800 MG tablet Take 800 mg by mouth every 8 (eight) hours as needed for pain. 12/11/20   [provider]  propranolol (INDERAL) 40 MG tablet Take 40 mg by mouth 3 (three) times daily. 12/11/20   [provider]  traMADol (ULTRAM) 50 MG tablet Take 50 mg by mouth 4 (four) times daily as needed for pain. 12/11/20   [provider]    Inpatient Medications: Scheduled Meds: . sodium chloride   Intravenous Once   Continuous Infusions: . sodium chloride 75 mL/hr at 12/20/20 0936  . pantoprozole (PROTONIX) infusion 8 mg/hr (12/20/20 0657)   PRN Meds: acetaminophen, hydrALAZINE, morphine injection, ondansetron (ZOFRAN) IV  Allergies:   Not on File  Social History:   Social History   Socioeconomic History  . Marital status: Married    Spouse name: Not on file  . Number of children: Not on file  . Years of education: Not on file  . Highest education level: Not on file  Occupational History  . Not on file  Tobacco Use  . Smoking status: Never Smoker  . Smokeless tobacco: Never Used  Substance and Sexual Activity  .  Alcohol use: Never  . Drug use: Never  . Sexual activity: Not on file  Other Topics Concern  . Not on file  Social History Narrative  . Not on file   Social Determinants of Health   Financial Resource Strain: Not on file  Food Insecurity: Not on file  Transportation Needs: Not on file  Physical Activity: Not on file  Stress: Not on file  Social Connections: Not on file  Intimate Partner Violence: Not on file    Family History:   Family History  Problem Relation Age of Onset  . Parkinson's disease Mother   . Stroke Father      ROS:   Please see the history of present illness.  All other ROS reviewed and negative.     Physical Exam/Data:   Vitals:   12/20/20 0615 12/20/20 0700 12/20/20 0830 12/20/20 0939  BP:  128/71 113/88 118/66  Pulse: 98 90 79 93  Resp: _0 Temp:    98.2 F (36.8 C)  TempSrc:    Oral  SpO2: 100% 100% 96% 100%  Weight:      Height:        Intake/Output Summary (Last 24 hours) at 12/20/2020 1101 Last data filed at 12/20/2020 0912 Gross per 24 hour  Intake 1101.31 ml  Output -  Net 1101.31 ml   Last 3 Weights 12/20/2020  Weight (lbs) 157 lb 3 oz  Weight (kg) 71.3 kg     Body mass index is 25.37 kg/m.  General:  Frail elderly WF HEENT: normal Lymph: no adenopathy Neck: no JVD Endocrine:  No thryomegaly Vascular: No carotid bruits; FA pulses 2+ bilaterally without bruits  Cardiac:  normal S1, S2; RRR; no murmur  Lungs:  clear to auscultation bilaterally, no wheezing, rhonchi or rales  Abd: soft, nontender, no hepatomegaly  Ext: no edema Musculoskeletal:  No deformities, BUE and BLE strength normal and equal Skin: warm and dry  Neuro:  CNs 2-12 intact, no focal abnormalities noted Psych:  Normal affect   EKG:  The EKG was personally reviewed and demonstrates:  Afib, 104bpm, PVCs Telemetry:  Telemetry was personally reviewed and demonstrates:  Afib, HR 90-110, PVCs  Relevant CV Studies:  Echo ordered  Laboratory Data:  High Sensitivity Troponin:   Recent Labs  Lab 12/20/20 0504  TROPONINIHS 9     Chemistry Recent Labs  Lab 12/20/20 0504  NA 140  K 3.5  CL 112*  CO2 20*  GLUCOSE 130*  BUN 65*  CREATININE 0.84  CALCIUM 9.2  GFRNONAA >60  ANIONGAP 8    Recent Labs  Lab 12/20/20 0504  PROT 5.8*  ALBUMIN 3.2*  AST 13*  ALT 11  ALKPHOS 53  BILITOT 0.7   Hematology Recent Labs  Lab 12/20/20 0504 12/20/20 0958  WBC 14.3* 8.7  RBC 2.25* 2.02*  2.02*  HGB 7.6* 6.7*  HCT 22.5* 20.4*  MCV 100.0 101.0*  MCH 33.8 33.2  MCHC 33.8 32.8  RDW  12.8 12.8  PLT 250 216   BNP Recent Labs  Lab 12/20/20 0508  BNP 227.9*    DDimer No results for input(s): DDIMER in the last 168 hours.   Radiology/Studies:  No results found.   Assessment and Plan:   Acute anemia/GIB - reported tarry stools for since early this morning. Also with sob, weakness, and pain and nausea - Hgb 7.6>6.7  - GI consulted,  - 1 unit PRBCs ordered - long history of ibuprofen use for arthritis -  plan for procedure this afternoon  Paroxysmal Afib - unsure duration of afib - rates labile, 90-110 - CHADSVASC at least 3 (agex2, HTN). No a/c with acute GIB. Can consider once resolved - echo ordered - check TSH. Keep Maf>2 and K>4 -  PTA was on propranolol for tremors. If BP tolerates start diltiazem for better rate control - MD to see  HTN - home meds held, amlodipine 59m, HCTZ 247mdaily propranolol 4037maily held - most recent BP soft - consider dilt as above  HLD - LDL 83 08/2019 - continue home atorvastatin   For questions or updates, please contact [email or web address] Please consult [email or web address] for contact info under    Signed, Cadence H FNinfa MeekerA-C  12/20/2020 11:01 AM

## 2020-12-20 NOTE — Transfer of Care (Signed)
Immediate Anesthesia Transfer of Care Note  Patient: [name] [facility]  Procedure(s) Performed: ESOPHAGOGASTRODUODENOSCOPY (EGD) (N/A )  Patient Location: PACU  Anesthesia Type:General  Level of Consciousness: awake and sedated  Airway & Oxygen Therapy: Patient Spontanous Breathing and Patient connected to nasal cannula oxygen  Post-op Assessment: Report given to RN and Post -op Vital signs reviewed and stable  Post vital signs: Reviewed and stable  Last Vitals:  Vitals Value Taken Time  BP 136/66 12/20/20 1554  Temp    Pulse    Resp 16 12/20/20 1554  SpO2    Vitals shown include unvalidated device data.  Last Pain:  Vitals:   12/20/20 1454  TempSrc: Temporal  PainSc: 0-No pain         Complications: No complications documented.

## 2020-12-20 NOTE — ED Notes (Signed)
Pharmacy contacted on behalf of primary RN requesting they send Octreotide and Protonix for RN to administer.

## 2020-12-20 NOTE — ED Provider Notes (Signed)
[facility] Emergency Department Provider Note   ____________________________________________   Event Date/Time   First MD Initiated Contact with Patient 12/20/20 [phone]     (approximate)  I have reviewed the triage vital signs and the nursing notes.   HISTORY  Chief Complaint Weakness    HPI Lauren Wheeler is a 87 y.o. female with a stated past medical history of hyperlipidemia, hypertension, and tremors who presents for increasing generalized weakness over the past 4 days as well as dark tarry stools.  Patient states that when she awoke this morning she was unable to get herself out of bed and called EMS.  Patient does state that she uses ibuprofen daily and has for years.  Patient denies any history of GI bleeding in the past.  Patient currently denies any vision changes, tinnitus, difficulty speaking, facial droop, sore throat, chest pain, shortness of breath, abdominal pain, nausea/vomiting/diarrhea, dysuria, or numbness/paresthesias in any extremity         No past medical history on file.  There are no problems to display for this patient.     Prior to Admission medications   Not on File    Allergies Patient has no allergy information on record.  No family history on file.  Social History    Review of Systems Constitutional: No fever/chills Eyes: No visual changes. ENT: No sore throat. Cardiovascular: Denies chest pain. Respiratory: Denies shortness of breath. Gastrointestinal: Endorses dark tarry stools.  No abdominal pain.  No nausea, no vomiting.  No diarrhea. Genitourinary: Negative for dysuria. Musculoskeletal: Negative for acute arthralgias Skin: Negative for rash. Neurological: Positive for generalized weakness, negative for headaches, numbness/paresthesias in any extremity Psychiatric: Negative for suicidal ideation/homicidal ideation   ____________________________________________   PHYSICAL EXAM:  VITAL SIGNS: ED  Triage Vitals  Enc Vitals Group     BP 12/20/20 0505 95/77     Pulse Rate 12/20/20 0505 81     Resp 12/20/20 0505 16     Temp 12/20/20 0505 97.7 F (36.5 C)     Temp Source 12/20/20 0505 Oral     SpO2 12/20/20 0505 98 %     Weight 12/20/20 0506 157 lb 3 oz (71.3 kg)     Height 12/20/20 0506 5\' 6"  (1.676 m)     Head Circumference --      Peak Flow --      Pain Score 12/20/20 0506 0     Pain Loc --      Pain Edu? --      Excl. in GC? --    Constitutional: Alert and oriented. Well appearing and in no acute distress. Eyes: Conjunctivae are pallid. PERRL. Head: Atraumatic. Nose: No congestion/rhinnorhea. Mouth/Throat: Mucous membranes are moist. Neck: No stridor Cardiovascular: Grossly normal heart sounds.  Good peripheral circulation. Respiratory: Normal respiratory effort.  No retractions. Gastrointestinal: Soft and nontender. No distention. Musculoskeletal: No obvious deformities Neurologic:  Normal speech and language. No gross focal neurologic deficits are appreciated. Skin:  Skin is warm and dry. No rash noted. Psychiatric: Mood and affect are normal. Speech and behavior are normal.  ____________________________________________   LABS (all labs ordered are listed, but only abnormal results are displayed)  Labs Reviewed  COMPREHENSIVE METABOLIC PANEL - Abnormal; Notable for the following components:      Result Value   Chloride 112 (*)    CO2 20 (*)    Glucose, Bld 130 (*)    BUN 65 (*)    Total Protein 5.8 (*)  Albumin 3.2 (*)    AST 13 (*)    All other components within normal limits  LACTIC ACID, PLASMA - Abnormal; Notable for the following components:   Lactic Acid, Venous 3.2 (*)    All other components within normal limits  CBC WITH DIFFERENTIAL/PLATELET - Abnormal; Notable for the following components:   WBC 14.3 (*)    RBC 2.25 (*)    Hemoglobin 7.6 (*)    HCT 22.5 (*)    Neutro Abs 12.1 (*)    Abs Immature Granulocytes 0.09 (*)    All other  components within normal limits  PROTIME-INR - Abnormal; Notable for the following components:   Prothrombin Time 21.8 (*)    INR 2.0 (*)    All other components within normal limits  RESP PANEL BY RT-PCR (FLU A&B, COVID) ARPGX2  LACTIC ACID, PLASMA  URINALYSIS, COMPLETE (UACMP) WITH MICROSCOPIC  TYPE AND SCREEN  TROPONIN I (HIGH SENSITIVITY)   ____________________________________________  EKG  ED ECG REPORT I, Merwyn Katos, the attending physician, personally viewed and interpreted this ECG.  Date: 12/20/2020 EKG Time: 0505 Rate: 104 Rhythm: normal sinus rhythm QRS Axis: normal Intervals: normal ST/T Wave abnormalities: normal Narrative Interpretation: no evidence of acute ischemia   PROCEDURES  Procedure(s) performed (including Critical Care):  .1-3 Lead EKG Interpretation Performed by: Merwyn Katos, MD Authorized by: Merwyn Katos, MD     Interpretation: normal     ECG rate:  96   ECG rate assessment: normal     Rhythm: sinus rhythm     Ectopy: none     Conduction: normal       ____________________________________________   INITIAL IMPRESSION / ASSESSMENT AND PLAN / ED COURSE  As part of my medical decision making, I reviewed the following data within the electronic MEDICAL RECORD NUMBER Nursing notes reviewed and incorporated, Labs reviewed, EKG interpreted, Old chart reviewed, and Notes from prior ED visits reviewed and incorporated        + abdominal pain + black stool per rectum Given history and exam patients presentation most consistent with upper GI bleed possibly secondary to peptic ulcer disease or variceal bleeding. I have low suspicion for aortoenteric fistula, ENT bleeding mimic, Boerhaaves, Pulmonary bleeding mimic.  Workup: CBC, BMP, LFTs, Lipase, PT/INR, Type and Screen  Interventions: Analgesia and antiemetic medications PRN Protonix 40mg  IVP Octreotide push -> 50 mcg/hr drip Ceftriaxone 1 gram IV PRBC transfusion  prn  Findings: Hb: 7.6  Disposition: Admit for close monitoring.      ____________________________________________   FINAL CLINICAL IMPRESSION(S) / ED DIAGNOSES  Final diagnoses:  Weakness  Symptomatic anemia  Upper GI bleed     ED Discharge Orders    None       Note:  This document was prepared using Dragon voice recognition software and may include unintentional dictation errors.   , MD 12/20/20 [phone]

## 2020-12-20 NOTE — Anesthesia Procedure Notes (Signed)
Date/Time: 12/20/2020 3:32 PM Performed by: Junious Silk, CRNA Pre-anesthesia Checklist: Patient identified, Emergency Drugs available, Suction available, Patient being monitored and Timeout performed Oxygen Delivery Method: Nasal cannula

## 2020-12-20 NOTE — ED Notes (Signed)
Pt currently nauseous. MD notified and orders placed for medication

## 2020-12-21 ENCOUNTER — Encounter: Payer: Self-pay | Admitting: Gastroenterology

## 2020-12-21 ENCOUNTER — Telehealth: Payer: Self-pay

## 2020-12-21 DIAGNOSIS — D500 Iron deficiency anemia secondary to blood loss (chronic): Secondary | ICD-10-CM | POA: Diagnosis not present

## 2020-12-21 DIAGNOSIS — I10 Essential (primary) hypertension: Secondary | ICD-10-CM | POA: Diagnosis not present

## 2020-12-21 DIAGNOSIS — K253 Acute gastric ulcer without hemorrhage or perforation: Secondary | ICD-10-CM | POA: Diagnosis not present

## 2020-12-21 DIAGNOSIS — K922 Gastrointestinal hemorrhage, unspecified: Secondary | ICD-10-CM | POA: Diagnosis not present

## 2020-12-21 LAB — CBC
HCT: 22.2 % — ABNORMAL LOW (ref 36.0–46.0)
HCT: 21.6 % — ABNORMAL LOW (ref 36.0–46.0)
Hemoglobin: 7.5 g/dL — ABNORMAL LOW (ref 12.0–15.0)
Hemoglobin: 7 g/dL — ABNORMAL LOW (ref 12.0–15.0)
MCH: 31.3 pg (ref 26.0–34.0)
MCH: 30.7 pg (ref 26.0–34.0)
MCHC: 33.8 g/dL (ref 30.0–36.0)
MCHC: 32.4 g/dL (ref 30.0–36.0)
MCV: 92.5 fL (ref 80.0–100.0)
MCV: 94.7 fL (ref 80.0–100.0)
Platelets: 220 10*3/uL (ref 150–400)
Platelets: 198 10*3/uL (ref 150–400)
RBC: 2.4 MIL/uL — ABNORMAL LOW (ref 3.87–5.11)
RBC: 2.28 MIL/uL — ABNORMAL LOW (ref 3.87–5.11)
RDW: 20 % — ABNORMAL HIGH (ref 11.5–15.5)
RDW: 19.8 % — ABNORMAL HIGH (ref 11.5–15.5)
WBC: 7.7 10*3/uL (ref 4.0–10.5)
WBC: 7.7 10*3/uL (ref 4.0–10.5)
nRBC: 0 % (ref 0.0–0.2)
nRBC: 0 % (ref 0.0–0.2)

## 2020-12-21 LAB — PREPARE RBC (CROSSMATCH)

## 2020-12-21 LAB — TSH: TSH: 1.158 u[IU]/mL (ref 0.350–4.500)

## 2020-12-21 LAB — GLUCOSE, CAPILLARY: Glucose-Capillary: 110 mg/dL — ABNORMAL HIGH (ref 70–99)

## 2020-12-21 MED ORDER — VITAMIN B-12 1000 MCG PO TABS: 1000.0000 ug | ORAL_TABLET | Freq: Every day | ORAL | Status: DC

## 2020-12-21 MED ORDER — CYANOCOBALAMIN 1000 MCG/ML IJ SOLN
1000.0000 ug | INTRAMUSCULAR | Status: AC
  Administered 2020-12-21 – 2020-12-23 (×3): 1000 ug via INTRAMUSCULAR
  Filled 2020-12-21 (×3): qty 1

## 2020-12-21 MED ORDER — SODIUM CHLORIDE 0.9% IV SOLUTION: Freq: Once | INTRAVENOUS | Status: DC

## 2020-12-21 NOTE — Progress Notes (Addendum)
[facility]  - [place] [facility]   PATIENT NAME: Lauren Wheeler    MR#:  [id number]  DATE OF BIRTH:  04/13/1927  SUBJECTIVE:  patient sitting up in the chair. Daughter at bedside. Tolerating PO liquid diet. No vomiting.  REVIEW OF SYSTEMS:   Review of Systems  Constitutional: Negative for chills, fever and weight loss.  HENT: Negative for ear discharge, ear pain and nosebleeds.   Eyes: Negative for blurred vision, pain and discharge.  Respiratory: Negative for sputum production, shortness of breath, wheezing and stridor.   Cardiovascular: Negative for chest pain, palpitations, orthopnea and PND.  Gastrointestinal: Positive for melena. Negative for abdominal pain, diarrhea, nausea and vomiting.  Genitourinary: Negative for frequency and urgency.  Musculoskeletal: Negative for back pain and joint pain.  Neurological: Positive for weakness. Negative for sensory change, speech change and focal weakness.  Psychiatric/Behavioral: Negative for depression and hallucinations. The patient is not nervous/anxious.    Tolerating Diet:CLD Tolerating PT: HHPT  DRUG ALLERGIES:  No Known Allergies  VITALS:  Blood pressure 102/70, pulse 75, temperature 98 F (36.7 C), resp. rate 16, height 5\' 6"  (1.676 m), weight 68 kg, SpO2 98 %.  PHYSICAL EXAMINATION:   Physical Exam  GENERAL:  87 y.o.-year-old patient lying in the bed with no acute distress.  LUNGS: Normal breath sounds bilaterally, no wheezing, rales, rhonchi. No use of accessory muscles of respiration.  CARDIOVASCULAR: S1, S2 normal. No murmurs, rubs, or gallops.  ABDOMEN: Soft, nontender, nondistended. Bowel sounds present. No organomegaly or mass.  EXTREMITIES: No cyanosis, clubbing or edema b/l.    NEUROLOGIC: Cranial nerves II through XII are intact. No focal Motor or sensory deficits b/l.   PSYCHIATRIC:  patient is alert and oriented x 3.  SKIN: No obvious rash, lesion, or ulcer.   LABORATORY PANEL:   CBC Recent Labs  Lab 12/21/20 1102  WBC 7.7  HGB 7.0*  HCT 21.6*  PLT 198    Chemistries  Recent Labs  Lab 12/20/20 0504  NA 140  K 3.5  CL 112*  CO2 20*  GLUCOSE 130*  BUN 65*  CREATININE 0.84  CALCIUM 9.2  AST 13*  ALT 11  ALKPHOS 53  BILITOT 0.7   Cardiac Enzymes No results for input(s): TROPONINI in the last 168 hours. RADIOLOGY:  ECHOCARDIOGRAM COMPLETE  Result Date: 12/20/2020    ECHOCARDIOGRAM REPORT   Patient Name:   Lauren Wheeler Date of Exam: 12/20/2020 Medical Rec #:  [id number]      Height:       66.0 in Accession #:    [id number]     Weight:       157.2 lb Date of Birth:  07/16/1927       BSA:          1.805 m Patient Age:    87 years       BP:           110/57 mmHg Patient Gender: F              HR:           106 bpm. Exam Location:  [facility] Procedure: 2D Echo, Cardiac Doppler and Color Doppler Indications:     Atrial Fibrillation I48.91  History:         Patient has no prior history of Echocardiogram examinations.                  Risk Factors:Hypertension and Dyslipidemia.  Sonographer:  Cristela Blue RDCS (AE) Referring Phys:  [id number] Francee Nodal FURTH Diagnosing Phys: Julien Nordmann MD  Sonographer Comments: Technically challenging study due to limited acoustic windows and suboptimal apical window. IMPRESSIONS  1. Left ventricular ejection fraction, by estimation, is 60 to 65%. The left ventricle has normal function. The left ventricle has no regional wall motion abnormalities. Left ventricular diastolic parameters are indeterminate.  2. Right ventricular systolic function is normal. The right ventricular size is normal. There is normal pulmonary artery systolic pressure. The estimated right ventricular systolic pressure is 32.5 mmHg.  3. Left atrial size was severely dilated.  4. Mild mitral valve regurgitation.  5. Rhythm is atrial fibrillation FINDINGS  Left Ventricle: Left ventricular ejection fraction, by estimation, is 60 to 65%. The left ventricle has normal  function. The left ventricle has no regional wall motion abnormalities. The left ventricular internal cavity size was normal in size. There is  no left ventricular hypertrophy. Left ventricular diastolic parameters are indeterminate. Right Ventricle: The right ventricular size is normal. No increase in right ventricular wall thickness. Right ventricular systolic function is normal. There is normal pulmonary artery systolic pressure. The tricuspid regurgitant velocity is 2.62 m/s, and  with an assumed right atrial pressure of 5 mmHg, the estimated right ventricular systolic pressure is 32.5 mmHg. Left Atrium: Left atrial size was severely dilated. Right Atrium: Right atrial size was normal in size. Pericardium: There is no evidence of pericardial effusion. Mitral Valve: The mitral valve is normal in structure. There is mild thickening of the mitral valve leaflet(s). Mild mitral annular calcification. Mild mitral valve regurgitation. No evidence of mitral valve stenosis. Tricuspid Valve: The tricuspid valve is normal in structure. Tricuspid valve regurgitation is mild . No evidence of tricuspid stenosis. Aortic Valve: The aortic valve is normal in structure. Aortic valve regurgitation is mild. Mild to moderate aortic valve sclerosis/calcification is present, without any evidence of aortic stenosis. Aortic valve mean gradient measures 3.0 mmHg. Aortic valve peak gradient measures 4.9 mmHg. Aortic valve area, by VTI measures 2.02 cm. Pulmonic Valve: The pulmonic valve was normal in structure. Pulmonic valve regurgitation is not visualized. No evidence of pulmonic stenosis. Aorta: The aortic root is normal in size and structure. Venous: The inferior vena cava is normal in size with greater than 50% respiratory variability, suggesting right atrial pressure of 3 mmHg. IAS/Shunts: No atrial level shunt detected by color flow Doppler.  LEFT VENTRICLE PLAX 2D LVIDd:         5.38 cm LVIDs:         2.86 cm LV PW:         1.14  cm LV IVS:        1.07 cm LVOT diam:     2.00 cm LV SV:         34 LV SV Index:   19 LVOT Area:     3.14 cm  RIGHT VENTRICLE RV Basal diam:  2.81 cm RV S prime:     8.05 cm/s LEFT ATRIUM             Index       RIGHT ATRIUM           Index LA diam:        5.40 cm 2.99 cm/m  RA Area:     23.10 cm LA Vol (A2C):   80.8 ml 44.76 ml/m RA Volume:   65.60 ml  36.34 ml/m LA Vol (A4C):   95.5 ml 52.90 ml/m LA Biplane Vol:  92.8 ml 51.41 ml/m  AORTIC VALVE                   PULMONIC VALVE AV Area (Vmax):    1.59 cm    PV Vmax:        0.82 m/s AV Area (Vmean):   1.61 cm    PV Peak grad:   2.7 mmHg AV Area (VTI):     2.02 cm    RVOT Peak grad: 2 mmHg AV Vmax:           110.50 cm/s AV Vmean:          85.750 cm/s AV VTI:            0.170 m AV Peak Grad:      4.9 mmHg AV Mean Grad:      3.0 mmHg LVOT Vmax:         56.10 cm/s LVOT Vmean:        44.000 cm/s LVOT VTI:          0.109 m LVOT/AV VTI ratio: 0.64  AORTA Ao Root diam: 3.10 cm MITRAL VALVE               TRICUSPID VALVE MV Area (PHT): 3.97 cm    TR Peak grad:   27.5 mmHg MV Decel Time: 191 msec    TR Vmax:        262.00 cm/s MV E velocity: 75.80 cm/s                            SHUNTS                            Systemic VTI:  0.11 m                            Systemic Diam: 2.00 cm Julien Nordmann MD Electronically signed by Julien Nordmann MD Signature Date/Time: 12/20/2020/2:50:01 PM    Final    ASSESSMENT AND PLAN:   Lauren Wheeler is a 87 y.o. female with medical history significant of HTN, HLD, irregular heartbeat, essential tremor, who presents with tarry stool. Patient states that she tarry stool in the past 3 days intermittently.  She has nausea, no vomiting.  She has abdominal pain, which is located in the left middle quadrant, constant, mild, aching, nonradiating.    GI bleeding and blood loss anemia:  NSAIDinduced PUD --Hgb 13.0 -->7.6 -->6.7--2 unit BT--7.8--7.5--7.0. Dr. Mia Creek is consulted. --EGD was performed, which showed "non-bleeding  gastric ulcer with a visible vessel. Injected. Treated with bipolar cautery" -- patient received IV fluid - Start IV pantoprazole gtt --.switched to 40 mg bid IV after EGD for three days per G.I. - Zofran IV for nausea - Avoid NSAIDs and SQ heparin --transfuse further as needed  HTN (hypertension): Blood pressure 118/71 -IV hydralazine as needed -Continue propranolol which is for essential tremor  HLD (hyperlipidemia) -Lipitor  Essential tremor -Propranolol  Unspecified atrial fibrillation (HCC):  --Appreciate Dr. Mariah Milling for cardiology --Continue propranolol --HR in the 70--80's  Leukocytosis: WBC 14.3. No source of infection identified, likely reactive  Elevated lactic acid level: Lactic acid 3.2, 2.7--1.1 possibly due to dehydration and GI bleed -Received IV fluid as above  patient overall improved remarkably.    DVT ppx: SCD Code Status: Full code Family Communication:  Yes, patient's daughter at bed side  Disposition Plan:  Anticipate discharge back to previous environment Consults called: Dr. Mia Creek Admission status and Level of care: :    Med-surg bed for obs  Status is: Inpatient  Remains inpatient appropriate because:Inpatient level of care appropriate due to severity of illness   Dispo: The patient is from: Home  Anticipated d/c is to: Home  Anticipated d/c date is:1-2 days  Patient currently is not medically stable to d/c.              Difficult to place patient No  G.I. recommends IV Protonix for total of three days. Transfuse blood as needed. Continue to monitor CBC daily.      TOTAL TIME TAKING CARE OF THIS PATIENT: *25* minutes.  >50% time spent on counselling and coordination of care  Note: This dictation was prepared with Dragon dictation along with smaller phrase technology. Any transcriptional errors that result from this process are unintentional.  Enedina Finner M.D    [facility]    CC: Primary care physician; Lupita Dawn, MDPatient ID: Lauren Wheeler, female   DOB: [birth year], 87 y.o.   MRN: [id number]

## 2020-12-21 NOTE — Evaluation (Signed)
Physical Therapy Evaluation Patient Details Name: Lauren Wheeler MRN: [id number] DOB: December 04, 1926 Today's Date: 12/21/2020   History of Present Illness  Patient is a 87 yo female that presented to ED for weakness, SOB, black stool. Workup showed gastric ulcer, has undergone blood transfusion. Also evaluated for afib PMH of HTN, HLD, irregular heart rhythm/PVCs, essential tremor.    Clinical Impression  Patient alert, with family at bedside, agreeable to PT and excited to try getting up. Pt reported at baseline she is ambulatory with a rollator, lives alone. Has had two falls due to difficulty with balance.   The patient was able to perform bed mobility with supervision from a flat bed. Able to sit with good balance, MMT revealed strong and symmetrical UE and LE strength. Sit <> stand with RW several times, CGA/supervision, cueing needed for hand placement (pt used to rollator breaks to assist with transfers). She was able to stand and ambulate ~147ft with RW and CGA. The patient did need 2-3 standing rest breaks due to poor activity tolerance/endurance, but no LOB noted. Decreased gait velocity noted as well.  Overall the patient demonstrated deficits (see "PT Problem List") that impede the patient's functional abilities, safety, and mobility and would benefit from skilled PT intervention. Recommendation is HHPT with intermittent supervision, pt and family agreeable to supervision at this time.      Follow Up Recommendations Home health PT;Supervision - Intermittent    Equipment Recommendations  None recommended by PT    Recommendations for Other Services       Precautions / Restrictions Precautions Precautions: Fall Restrictions Weight Bearing Restrictions: No      Mobility  Bed Mobility Overal bed mobility: Needs Assistance Bed Mobility: Supine to Sit     Supine to sit: Supervision          Transfers Overall transfer level: Needs assistance Equipment used: Rolling walker  (2 wheeled) Transfers: Sit to/from Stand Sit to Stand: Supervision;Min guard         General transfer comment: cued for hand placement, pt used to rollator breaks to assist with transfer  Ambulation/Gait Ambulation/Gait assistance: Min guard Gait Distance (Feet): 100 Feet Assistive device: Rolling walker (2 wheeled)       General Gait Details: pt with decreased gait velocity, 2-3 standing rest breaks led by pt due to fatigue. no LOB noted  Stairs            Wheelchair Mobility    Modified Rankin (Stroke Patients Only)       Balance Overall balance assessment: Needs assistance Sitting-balance support: Feet supported Sitting balance-Leahy Scale: Good Sitting balance - Comments: difficulty maintaining seated balance with MMT testing     Standing balance-Leahy Scale: Good Standing balance comment: reliant on UE support for dynamic standing/ambulation                             Pertinent Vitals/Pain Pain Assessment:  (pt reported general body achiness, stated its chronic and due to arthritis)    Home Living Family/patient expects to be discharged to:: Private residence Living Arrangements: Alone Available Help at Discharge: Family;Available PRN/intermittently;Other (Comment) (family lives nearby, checks in daily) Type of Home: House Home Access: Ramped entrance (has two steps from den into rest of the house, grab bar on teh L)     Home Layout: One level Home Equipment: Grab bars - tub/shower;Walker - 4 wheels;Shower seat Additional Comments: 2 falls in the last 6 months  Prior Function Level of Independence: Independent with assistive device(s)         Comments: independent with mobility modI (rollator). able to perform ADLs without assist. Family provides supervision for community activites as well as assist up/down ramp. Pt able to do some cleaning, cooking     Hand Dominance   Dominant Hand: Right    Extremity/Trunk Assessment    Upper Extremity Assessment Upper Extremity Assessment: Overall WFL for tasks assessed    Lower Extremity Assessment Lower Extremity Assessment: Overall WFL for tasks assessed (grossly 4+/5)    Cervical / Trunk Assessment Cervical / Trunk Assessment: Normal  Communication   Communication: No difficulties  Cognition Arousal/Alertness: Awake/alert Behavior During Therapy: WFL for tasks assessed/performed Overall Cognitive Status: Within Functional Limits for tasks assessed                                        General Comments      Exercises     Assessment/Plan    PT Assessment Patient needs continued PT services  PT Problem List Decreased mobility;Decreased activity tolerance;Decreased balance;Decreased strength       PT Treatment Interventions DME instruction;Therapeutic exercise;Gait training;Balance training;Stair training;Neuromuscular re-education;Functional mobility training;Therapeutic activities;Patient/family education    PT Goals (Current goals can be found in the Care Plan section)  Acute Rehab PT Goals Patient Stated Goal: to get stronger PT Goal Formulation: With patient Time For Goal Achievement: 01/04/21 Potential to Achieve Goals: Good    Frequency Min 2X/week   Barriers to discharge        Co-evaluation               AM-PAC PT "6 Clicks" Mobility  Outcome Measure Help needed turning from your back to your side while in a flat bed without using bedrails?: None Help needed moving from lying on your back to sitting on the side of a flat bed without using bedrails?: None Help needed moving to and from a bed to a chair (including a wheelchair)?: None Help needed standing up from a chair using your arms (e.g., wheelchair or bedside chair)?: None Help needed to walk in hospital room?: A Little Help needed climbing 3-5 steps with a railing? : A Little 6 Click Score: 22    End of Session Equipment Utilized During Treatment: Gait  belt Activity Tolerance: Patient tolerated treatment well Patient left: in chair;with call bell/phone within reach;with family/visitor present Nurse Communication: Mobility status PT Visit Diagnosis: Other abnormalities of gait and mobility (R26.89);Muscle weakness (generalized) (M62.81);Difficulty in walking, not elsewhere classified (R26.2)    Time: 7793-9030 PT Time Calculation (min) (ACUTE ONLY): 27 min   Charges:   PT Evaluation $PT Eval Low Complexity: 1 Low PT Treatments $Therapeutic Exercise: 8-22 mins        Olga Coaster PT, DPT 2:32 PM,12/21/20

## 2020-12-21 NOTE — Progress Notes (Signed)
Received in report from [facility] RN that on-call provider was consulted to see if patient needed second unit of blood ordered yesterday, and due to hgb being stable it was held.

## 2020-12-21 NOTE — Progress Notes (Signed)
GI Inpatient Follow-up Note  Subjective:  Patient seen and doing well. No bowel movements overnight  Scheduled Inpatient Medications:  . atorvastatin  10 mg Oral QHS  . cyanocobalamin  1,000 mcg Intramuscular Q24H   Followed by  . [START ON 12/24/2020] vitamin B-12  1,000 mcg Oral Daily  . pantoprazole (PROTONIX) IV  40 mg Intravenous Q12H  . propranolol  40 mg Oral TID    Continuous Inpatient Infusions:    PRN Inpatient Medications:  acetaminophen, hydrALAZINE, morphine injection, ondansetron (ZOFRAN) IV, traMADol  Review of Systems:  Review of Systems  Constitutional: Negative for chills and fever.  Respiratory: Negative for cough.   Cardiovascular: Negative for chest pain.  Gastrointestinal: Negative for abdominal pain, blood in stool, constipation, diarrhea, heartburn, melena, nausea and vomiting.  Musculoskeletal: Positive for neck pain.  Skin: Negative for itching and rash.  Neurological: Negative for focal weakness.  Psychiatric/Behavioral: Negative for substance abuse.  All other systems reviewed and are negative.    Physical Examination: BP 102/70 (BP Location: Left Arm)   Pulse 75   Temp 98 F (36.7 C)   Resp 16   Ht 5\' 6"  (1.676 m)   Wt 68 kg   SpO2 98%   BMI 24.21 kg/m  Gen: NAD, alert and oriented x 4 HEENT: PEERLA, EOMI, Neck: supple, no JVD or thyromegaly Chest: No respiratory distress Abd: soft, non-tender, non-distneded Ext: no edema, well perfused with 2+ pulses, Skin: no rash or lesions noted Lymph: no LAD  Data: Lab Results  Component Value Date   WBC 7.7 12/21/2020   HGB 7.0 (L) 12/21/2020   HCT 21.6 (L) 12/21/2020   MCV 94.7 12/21/2020   PLT 198 12/21/2020   Recent Labs  Lab 12/20/20 2321 12/21/20 0515 12/21/20 1102  HGB 7.2* 7.5* 7.0*   Lab Results  Component Value Date   NA 140 12/20/2020   K 3.5 12/20/2020   CL 112 (H) 12/20/2020   CO2 20 (L) 12/20/2020   BUN 65 (H) 12/20/2020   CREATININE 0.84 12/20/2020   Lab  Results  Component Value Date   ALT 11 12/20/2020   AST 13 (L) 12/20/2020   ALKPHOS 53 12/20/2020   BILITOT 0.7 12/20/2020   Recent Labs  Lab 12/20/20 0958  APTT 31  INR 1.2   Assessment/Plan: Ms. Escandon is a 87 y.o. lady with NSAID induced gastric ulcer requiring epi and bipolar cautery for a visible vessel. Clinically improved.  Recommendations:  - IV PPI BID for total of three days (early Sunday) - ok to advance diet - daily CBC's - if does well through Sunday then can be discharged on PPI PO BID for 12 weeks. We will arrange clinic follow-up after discharge with plans for repeat EGD in 8 weeks to assess ulcer healing - would recommend blood transfusion at this time - will need PO iron every other day at discharge  Dr. Monday is covering over the weekend if any questions.  Allegra Lai MD, MPH [facility]

## 2020-12-21 NOTE — Telephone Encounter (Signed)
-----   Message from Cadence David Stall, PA-C sent at 12/21/2020  8:20 AM EST ----- Regarding: hospital follow-up Patient seen in the hospital for afib, new dx but likely persistent. She had GIB so needs GI work-up before initiation of anticoagulation. Unsure how long she will be in the hospital, but she needs appointment for 3-4 weeks or after EGD procedure in the office. Thanks!

## 2020-12-21 NOTE — Progress Notes (Addendum)
RN arrived on floor with unit of blood for transfusion. When reading off blood information, daughter and patient concerned as unit of blood was O+ and patient blood type O-.  RN notified daughter and patient that per Casimiro Needle in blood bank (confirmed by his supervisor) it is protocol within this facility to transfuse O+ to O- patients due to blood shortage and reserve O- for women of childbearing age.  Discussed with blood bank employee, Casimiro Needle who confirmed this with the patient/daughter.  Explained blood was cross matched against her blood to assess for any reactions and there were none.  When discussing RN did not exclude any risk for transfusion reaction, but explained that Rh reactions are from development of antibodies which occurs over time.  They verbalize understanding and maintain refusal of blood product.  Patient's last hemoglobin 7.0.  She is not short of breath, hypotensive, dizzy, or tachycardic. Enedina Finner, MD notified.  She discontinued transfusion orders and states as long as patient isn't symptomatic we can wait until CBC results come back tomorrow.  Patient/daughter made aware and agree with plan of care.

## 2020-12-22 DIAGNOSIS — I10 Essential (primary) hypertension: Secondary | ICD-10-CM | POA: Diagnosis not present

## 2020-12-22 DIAGNOSIS — K922 Gastrointestinal hemorrhage, unspecified: Secondary | ICD-10-CM | POA: Diagnosis not present

## 2020-12-22 DIAGNOSIS — D519 Vitamin B12 deficiency anemia, unspecified: Secondary | ICD-10-CM

## 2020-12-22 DIAGNOSIS — K253 Acute gastric ulcer without hemorrhage or perforation: Secondary | ICD-10-CM | POA: Diagnosis not present

## 2020-12-22 DIAGNOSIS — D500 Iron deficiency anemia secondary to blood loss (chronic): Secondary | ICD-10-CM | POA: Diagnosis not present

## 2020-12-22 LAB — CBC
HCT: 21.1 % — ABNORMAL LOW (ref 36.0–46.0)
Hemoglobin: 6.9 g/dL — ABNORMAL LOW (ref 12.0–15.0)
MCH: 30.8 pg (ref 26.0–34.0)
MCHC: 32.7 g/dL (ref 30.0–36.0)
MCV: 94.2 fL (ref 80.0–100.0)
Platelets: 212 10*3/uL (ref 150–400)
RBC: 2.24 MIL/uL — ABNORMAL LOW (ref 3.87–5.11)
RDW: 19.1 % — ABNORMAL HIGH (ref 11.5–15.5)
WBC: 6.8 10*3/uL (ref 4.0–10.5)
nRBC: 0 % (ref 0.0–0.2)

## 2020-12-22 LAB — BASIC METABOLIC PANEL
Anion gap: 7 (ref 5–15)
BUN: 31 mg/dL — ABNORMAL HIGH (ref 8–23)
CO2: 23 mmol/L (ref 22–32)
Calcium: 8.7 mg/dL — ABNORMAL LOW (ref 8.9–10.3)
Chloride: 109 mmol/L (ref 98–111)
Creatinine, Ser: 0.96 mg/dL (ref 0.44–1.00)
GFR, Estimated: 56 mL/min — ABNORMAL LOW (ref >60)
Glucose, Bld: 119 mg/dL — ABNORMAL HIGH (ref 70–99)
Potassium: 4 mmol/L (ref 3.5–5.1)
Sodium: 139 mmol/L (ref 135–145)

## 2020-12-22 NOTE — Progress Notes (Signed)
[facility]  - [facility] [place] [facility]   PATIENT NAME: Lauren Wheeler    MR#:  [id number]  DATE OF BIRTH:  [birth year]  SUBJECTIVE:  patient sitting up in bed doing her crossword puzzle on iPad. No complaints. Tolerating PO diet. Worked with physical therapy. Daughter at bedside.  REVIEW OF SYSTEMS:   Review of Systems  Constitutional: Negative for chills, fever and weight loss.  HENT: Negative for ear discharge, ear pain and nosebleeds.   Eyes: Negative for blurred vision, pain and discharge.  Respiratory: Negative for sputum production, shortness of breath, wheezing and stridor.   Cardiovascular: Negative for chest pain, palpitations, orthopnea and PND.  Gastrointestinal: Positive for melena. Negative for abdominal pain, diarrhea, nausea and vomiting.  Genitourinary: Negative for frequency and urgency.  Musculoskeletal: Negative for back pain and joint pain.  Neurological: Positive for weakness. Negative for sensory change, speech change and focal weakness.  Psychiatric/Behavioral: Negative for depression and hallucinations. The patient is not nervous/anxious.    Tolerating Diet:CLD Tolerating PT: HHPT  DRUG ALLERGIES:  No Known Allergies  VITALS:  Blood pressure 93/63, pulse 68, temperature 98.9 F (37.2 C), temperature source Oral, resp. rate 16, height 5\' 6"  (1.676 m), weight 68 kg, SpO2 94 %.  PHYSICAL EXAMINATION:   Physical Exam  GENERAL:  87 y.o.-year-old patient lying in the bed with no acute distress.  LUNGS: Normal breath sounds bilaterally, no wheezing, rales, rhonchi. No use of accessory muscles of respiration.  CARDIOVASCULAR: S1, S2 normal. No murmurs, rubs, or gallops.  ABDOMEN: Soft, nontender, nondistended. Bowel sounds present. No organomegaly or mass.  EXTREMITIES: No cyanosis, clubbing or edema b/l.    NEUROLOGIC: Cranial nerves II through XII are intact. No focal Motor or sensory deficits b/l.   PSYCHIATRIC:  patient is alert and oriented x  3.  SKIN: No obvious rash, lesion, or ulcer.   LABORATORY PANEL:  CBC Recent Labs  Lab 12/22/20 0555  WBC 6.8  HGB 6.9*  HCT 21.1*  PLT 212    Chemistries  Recent Labs  Lab 12/20/20 0504 12/22/20 0555  NA 140 139  K 3.5 4.0  CL 112* 109  CO2 20* 23  GLUCOSE 130* 119*  BUN 65* 31*  CREATININE 0.84 0.96  CALCIUM 9.2 8.7*  AST 13*  --   ALT 11  --   ALKPHOS 53  --   BILITOT 0.7  --    Cardiac Enzymes No results for input(s): TROPONINI in the last 168 hours. RADIOLOGY:  ECHOCARDIOGRAM COMPLETE  Result Date: 12/20/2020    ECHOCARDIOGRAM REPORT   Patient Name:   Lauren Wheeler Date of Exam: 12/20/2020 Medical Rec #:  12/22/2020      Height:       66.0 in Accession #:    [id number]     Weight:       157.2 lb Date of Birth:  03-05-1927       BSA:          1.805 m Patient Age:    87 years       BP:           110/57 mmHg Patient Gender: F              HR:           106 bpm. Exam Location:  [facility] Procedure: 2D Echo, Cardiac Doppler and Color Doppler Indications:     Atrial Fibrillation I48.91  History:         Patient  has no prior history of Echocardiogram examinations.                  Risk Factors:Hypertension and Dyslipidemia.  Sonographer:     Cristela Blue RDCS (AE) Referring Phys:  [id number] Francee Nodal FURTH Diagnosing Phys: Julien Nordmann MD  Sonographer Comments: Technically challenging study due to limited acoustic windows and suboptimal apical window. IMPRESSIONS  1. Left ventricular ejection fraction, by estimation, is 60 to 65%. The left ventricle has normal function. The left ventricle has no regional wall motion abnormalities. Left ventricular diastolic parameters are indeterminate.  2. Right ventricular systolic function is normal. The right ventricular size is normal. There is normal pulmonary artery systolic pressure. The estimated right ventricular systolic pressure is 32.5 mmHg.  3. Left atrial size was severely dilated.  4. Mild mitral valve regurgitation.  5. Rhythm is atrial  fibrillation FINDINGS  Left Ventricle: Left ventricular ejection fraction, by estimation, is 60 to 65%. The left ventricle has normal function. The left ventricle has no regional wall motion abnormalities. The left ventricular internal cavity size was normal in size. There is  no left ventricular hypertrophy. Left ventricular diastolic parameters are indeterminate. Right Ventricle: The right ventricular size is normal. No increase in right ventricular wall thickness. Right ventricular systolic function is normal. There is normal pulmonary artery systolic pressure. The tricuspid regurgitant velocity is 2.62 m/s, and  with an assumed right atrial pressure of 5 mmHg, the estimated right ventricular systolic pressure is 32.5 mmHg. Left Atrium: Left atrial size was severely dilated. Right Atrium: Right atrial size was normal in size. Pericardium: There is no evidence of pericardial effusion. Mitral Valve: The mitral valve is normal in structure. There is mild thickening of the mitral valve leaflet(s). Mild mitral annular calcification. Mild mitral valve regurgitation. No evidence of mitral valve stenosis. Tricuspid Valve: The tricuspid valve is normal in structure. Tricuspid valve regurgitation is mild . No evidence of tricuspid stenosis. Aortic Valve: The aortic valve is normal in structure. Aortic valve regurgitation is mild. Mild to moderate aortic valve sclerosis/calcification is present, without any evidence of aortic stenosis. Aortic valve mean gradient measures 3.0 mmHg. Aortic valve peak gradient measures 4.9 mmHg. Aortic valve area, by VTI measures 2.02 cm. Pulmonic Valve: The pulmonic valve was normal in structure. Pulmonic valve regurgitation is not visualized. No evidence of pulmonic stenosis. Aorta: The aortic root is normal in size and structure. Venous: The inferior vena cava is normal in size with greater than 50% respiratory variability, suggesting right atrial pressure of 3 mmHg. IAS/Shunts: No atrial  level shunt detected by color flow Doppler.  LEFT VENTRICLE PLAX 2D LVIDd:         5.38 cm LVIDs:         2.86 cm LV PW:         1.14 cm LV IVS:        1.07 cm LVOT diam:     2.00 cm LV SV:         34 LV SV Index:   19 LVOT Area:     3.14 cm  RIGHT VENTRICLE RV Basal diam:  2.81 cm RV S prime:     8.05 cm/s LEFT ATRIUM             Index       RIGHT ATRIUM           Index LA diam:        5.40 cm 2.99 cm/m  RA Area:  23.10 cm LA Vol (A2C):   80.8 ml 44.76 ml/m RA Volume:   65.60 ml  36.34 ml/m LA Vol (A4C):   95.5 ml 52.90 ml/m LA Biplane Vol: 92.8 ml 51.41 ml/m  AORTIC VALVE                   PULMONIC VALVE AV Area (Vmax):    1.59 cm    PV Vmax:        0.82 m/s AV Area (Vmean):   1.61 cm    PV Peak grad:   2.7 mmHg AV Area (VTI):     2.02 cm    RVOT Peak grad: 2 mmHg AV Vmax:           110.50 cm/s AV Vmean:          85.750 cm/s AV VTI:            0.170 m AV Peak Grad:      4.9 mmHg AV Mean Grad:      3.0 mmHg LVOT Vmax:         56.10 cm/s LVOT Vmean:        44.000 cm/s LVOT VTI:          0.109 m LVOT/AV VTI ratio: 0.64  AORTA Ao Root diam: 3.10 cm MITRAL VALVE               TRICUSPID VALVE MV Area (PHT): 3.97 cm    TR Peak grad:   27.5 mmHg MV Decel Time: 191 msec    TR Vmax:        262.00 cm/s MV E velocity: 75.80 cm/s                            SHUNTS                            Systemic VTI:  0.11 m                            Systemic Diam: 2.00 cm Julien Nordmann MD Electronically signed by Julien Nordmann MD Signature Date/Time: 12/20/2020/2:50:01 PM    Final    ASSESSMENT AND PLAN:   Lauren Wheeler is a 87 y.o. female with medical history significant of HTN, HLD, irregular heartbeat, essential tremor, who presents with tarry stool. Patient states that she tarry stool in the past 3 days intermittently.  She has nausea, no vomiting.  She has abdominal pain, which is located in the left middle quadrant, constant, mild, aching, nonradiating.    GI bleeding and blood loss anemia:  NSAIDinduced  PUD --Hgb 13.0 -->7.6 -->6.7--2 unit BT--7.8--7.5--7.0. --6.9 --Dr. Mart Piggs consult appreciated . --EGD was performed, which showed "non-bleeding gastric ulcer with a visible vessel. Injected. Treated with bipolar cautery" -- patient received IV fluid - Start IV pantoprazole gtt --.switched to 40 mg bid IV after EGD for three days per G.I. - Zofran IV for nausea - Avoid NSAIDs and SQ heparin --transfuse further as needed. --2/25-- due to shortage of blood oh positive blood was offered. Patient and daughter declined transfusion. --2/26-- hemoglobin 6.9. Continue to monitor.  Severe vitamin B12 deficiency -- started on IM B12 shots times one for three days -- will prescribed oral B12 at discharge.  HTN (hypertension): Blood pressure 118/71 -IV hydralazine as needed -Continue propranolol which is for essential tremor  HLD (  hyperlipidemia) -Lipitor  Essential tremor -Propranolol  Unspecified atrial fibrillation (HCC):  --Appreciate Dr. Mariah MillingGollan for cardiology --Continue propranolol --HR in the 70--80's  Leukocytosis: WBC 14.3. No source of infection identified, likely reactive  Elevated lactic acid level: Lactic acid 3.2, 2.7--1.1 possibly due to dehydration and GI bleed -Received IV fluid as above  patient overall improved remarkably.If continues to show improvement will discharged to home likely tomorrow    DVT ppx: SCD Code Status: Full code Family Communication:  Yes, patient's daughter at bed side Disposition Plan:  Anticipate discharge back to previous environment Consults called: Dr. Andrey CotaLocklear/vanga Admission status and Level of care: :    Med-surg bed for obs  Status is: Inpatient   Dispo: The patient is from: Home  Anticipated d/c is to: Home  Anticipated d/c date is:1 day  Patient currently is  medically stable to d/c however needs one more days of IV Protonix.              Difficult to place patient  No  G.I. recommends IV Protonix for total of three days. Transfuse blood as needed. Continue to monitor CBC daily.      TOTAL TIME TAKING CARE OF THIS PATIENT: *25* minutes.  >50% time spent on counselling and coordination of care  Note: This dictation was prepared with Dragon dictation along with smaller phrase technology. Any transcriptional errors that result from this process are unintentional.  Lauren FinnerSona Adel Wheeler M.D    [facility]   CC: Primary care physician; Lupita Dawnorey, Lauren Wheeler, MDPatient ID: Lauren Wheeler, female   DOB: 05/31/1927, 87 y.o.   MRN: [id number]

## 2020-12-23 DIAGNOSIS — G250 Essential tremor: Secondary | ICD-10-CM | POA: Diagnosis not present

## 2020-12-23 DIAGNOSIS — E785 Hyperlipidemia, unspecified: Secondary | ICD-10-CM

## 2020-12-23 DIAGNOSIS — K253 Acute gastric ulcer without hemorrhage or perforation: Secondary | ICD-10-CM | POA: Diagnosis not present

## 2020-12-23 DIAGNOSIS — K922 Gastrointestinal hemorrhage, unspecified: Secondary | ICD-10-CM | POA: Diagnosis not present

## 2020-12-23 MED ORDER — TRAMADOL HCL 50 MG PO TABS: 50.0000 mg | ORAL_TABLET | Freq: Two times a day (BID) | ORAL | 0 refills | Status: AC | PRN

## 2020-12-23 MED ORDER — CYANOCOBALAMIN 1000 MCG PO TABS: 1000.0000 ug | ORAL_TABLET | Freq: Every day | ORAL | 1 refills | Status: AC

## 2020-12-23 MED ORDER — PANTOPRAZOLE SODIUM 40 MG PO TBEC: 40.0000 mg | DELAYED_RELEASE_TABLET | Freq: Two times a day (BID) | ORAL | 3 refills | Status: AC

## 2020-12-23 MED ORDER — PANTOPRAZOLE SODIUM 40 MG PO TBEC: 40.0000 mg | DELAYED_RELEASE_TABLET | Freq: Two times a day (BID) | ORAL | Status: DC

## 2020-12-23 NOTE — Discharge Instructions (Signed)
Pt is ok to take OTC iron pills daily.  I have held your amlodipine and HCTZ fro now due to soft BP. D/w your PCP if and when you can resume. Keep log of sugars at home.

## 2020-12-23 NOTE — Discharge Summary (Signed)
[facility] - [place] [facility]   PATIENT NAME: Lauren Wheeler    MR#:  [id number]  DATE OF BIRTH:  Mar 05, 1927  DATE OF ADMISSION:  12/20/2020 ADMITTING PHYSICIAN: Lorretta Harp, MD  DATE OF DISCHARGE: 12/23/2020  PRIMARY CARE PHYSICIAN: Lupita Dawn, MD    ADMISSION DIAGNOSIS:  GI bleeding [K92.2] Weakness [R53.1] Upper GI bleed [K92.2] Symptomatic anemia [D64.9]  DISCHARGE DIAGNOSIS:  GI bleed due to NSAID induced Gastric ulcer Vitamin B12 deficiency Acute Blood loss anemia  SECONDARY DIAGNOSIS:   Past Medical History:  Diagnosis Date  . Essential tremor   . HLD (hyperlipidemia)   . HTN (hypertension)     HOSPITAL COURSE:   Unnamed Hino Dismukeis a 87 y.o.femalewith medical history significant ofHTN, HLD,irregular heartbeat,essential tremor, who presents with tarry stool. Patient states that she tarry stool in the past 3 days intermittently. She has nausea, no vomiting. She has abdominal pain, which is located in the left middle quadrant, constant, mild, aching, nonradiating.   GI bleedingand blood loss anemia:  NSAIDinduced PUD --Hgb 13.7(9024) -->7.6 -->6.7--2 unit BT--7.8--7.5--7.0. --6.9 --Dr. Mart Piggs consult appreciated . --EGD was performed, which showed "non-bleeding gastric ulcer with a visible vessel. Injected. Treated with bipolar cautery" -- patient received IV fluid - Start IV pantoprazole gtt--.switched to 40 mg bid IV after EGD for three days per G.I--now on po BID ppi at discharge - Avoid NSAIDs and SQ heparin --2/25-- due to shortage of blood oh positive blood was offered. Patient and daughter declined transfusion. --2/26-- hemoglobin 6.9. Continue to monitor. --2/27--improving. No complaints. Pt advised to f/u [facility] and PCP. Will ned CBC in 1 week --she is ok to take OTC iron pills  Severe vitamin B12 deficiency -- started on IM B12 shots times one for three days--completed -- prescribed oral B12 at discharge.  HTN  (hypertension):Blood pressure 118/71 -IVhydralazine as needed --holding amlodipine and HCTZ  HLD (hyperlipidemia) -Lipitor  Essential tremor -Propranolol  Unspecified atrial fibrillation (HCC):  --Appreciate Dr. Mariah Milling for cardiology --Continue propranolol --HR in the 70--80's  Leukocytosis: WBC 14.3--6.8 -- No source of infection identified, likely reactive  Elevated lactic acid level: Lactic acid 3.2, 2.7--1.1 possibly due to dehydration and GI bleed -Received IV fluid as above   DVT ppx: SCD Code Status:Full code Family Communication: Yes, patient'sdaughterat bed side Disposition Plan: Anticipate discharge back to previous environment Consults called:Dr. Andrey Cota Admission status and Level of care: : Med-surg bed for obs  Status is: Inpatient   Dispo: The patient is from:Home Anticipated d/c is OX:[facility] with HHPT Anticipated d/c date is:1 day Patient currently is  medically stable to d/c   Overall optimized and d/c home. D/c d/w pt, dter at bedside and GI (dr Allegra Lai) CONSULTS OBTAINED:  Treatment Team:  Toney Reil, MD  DRUG ALLERGIES:  No Known Allergies  DISCHARGE MEDICATIONS:   Allergies as of 12/23/2020   No Known Allergies     Medication List    STOP taking these medications   amLODipine 5 MG tablet Commonly known as: NORVASC   DermOtic 0.01 % Oil Generic drug: Fluocinolone Acetonide   hydrochlorothiazide 25 MG tablet Commonly known as: HYDRODIURIL   IBU 800 MG tablet Generic drug: ibuprofen     TAKE these medications   atorvastatin 10 MG tablet Commonly known as: LIPITOR Take 10 mg by mouth daily.   cyanocobalamin 1000 MCG tablet Take 1 tablet (1,000 mcg total) by mouth daily. Start taking on: December 24, 2020   pantoprazole 40 MG tablet Commonly  known as: PROTONIX Take 1 tablet (40 mg total) by mouth 2 (two) times daily before a meal. Start taking on:  December 24, 2020   propranolol 40 MG tablet Commonly known as: INDERAL Take 40 mg by mouth 3 (three) times daily.   traMADol 50 MG tablet Commonly known as: ULTRAM Take 1 tablet (50 mg total) by mouth every 12 (twelve) hours as needed for severe pain. What changed:   when to take this  reasons to take this       If you experience worsening of your admission symptoms, develop shortness of breath, life threatening emergency, suicidal or homicidal thoughts you must seek medical attention immediately by calling 911 or calling your MD immediately  if symptoms less severe.  You Must read complete instructions/literature along with all the possible adverse reactions/side effects for all the Medicines you take and that have been prescribed to you. Take any new Medicines after you have completely understood and accept all the possible adverse reactions/side effects.   Please note  You were cared for by a hospitalist during your hospital stay. If you have any questions about your discharge medications or the care you received while you were in the hospital after you are discharged, you can call the unit and asked to speak with the hospitalist on call if the hospitalist that took care of you is not available. Once you are discharged, your primary care physician will handle any further medical issues. Please note that NO REFILLS for any discharge medications will be authorized once you are discharged, as it is imperative that you return to your primary care physician (or establish a relationship with a primary care physician if you do not have one) for your aftercare needs so that they can reassess your need for medications and monitor your lab values. Today   SUBJECTIVE   No new complaints. dter at bedside  VITAL SIGNS:  Blood pressure 118/70, pulse 79, temperature 98 F (36.7 C), temperature source Oral, resp. rate 16, height 5\' 6"  (1.676 m), weight 68 kg, SpO2 100 %.  I/O:     Intake/Output Summary (Last 24 hours) at 12/23/2020 0835 Last data filed at 12/22/2020 1900 Gross per 24 hour  Intake 620 ml  Output -  Net 620 ml    PHYSICAL EXAMINATION:  GENERAL:  87 y.o.-year-old patient lying in the bed with no acute distress.  LUNGS: Normal breath sounds bilaterally, no wheezing, rales,rhonchi or crepitation. No use of accessory muscles of respiration.  CARDIOVASCULAR: S1, S2 normal. No murmurs, rubs, or gallops.  ABDOMEN: Soft, non-tender, non-distended. Bowel sounds present. No organomegaly or mass.  EXTREMITIES: No pedal edema, cyanosis, or clubbing. DJD+ NEUROLOGIC:grossly nonfocal PSYCHIATRIC: The patient is alert and oriented x 3.  SKIN: No obvious rash, lesion, or ulcer.   DATA REVIEW:   CBC  Recent Labs  Lab 12/22/20 0555  WBC 6.8  HGB 6.9*  HCT 21.1*  PLT 212    Chemistries  Recent Labs  Lab 12/20/20 0504 12/22/20 0555  NA 140 139  K 3.5 4.0  CL 112* 109  CO2 20* 23  GLUCOSE 130* 119*  BUN 65* 31*  CREATININE 0.84 0.96  CALCIUM 9.2 8.7*  AST 13*  --   ALT 11  --   ALKPHOS 53  --   BILITOT 0.7  --     Microbiology Results   Recent Results (from the past 240 hour(s))  Resp Panel by RT-PCR (Flu A&B, Covid) Nasopharyngeal Swab  Status: None   Collection Time: 12/20/20  5:25 AM   Specimen: Nasopharyngeal Swab; Nasopharyngeal(NP) swabs in vial transport medium  Result Value Ref Range Status   SARS Coronavirus 2 by RT PCR NEGATIVE NEGATIVE Final    Comment: (NOTE) SARS-CoV-2 target nucleic acids are NOT DETECTED.  The SARS-CoV-2 RNA is generally detectable in upper respiratory specimens during the acute phase of infection. The lowest concentration of SARS-CoV-2 viral copies this assay can detect is 138 copies/mL. A negative result does not preclude SARS-Cov-2 infection and should not be used as the sole basis for treatment or other patient management decisions. A negative result may occur with  improper specimen  collection/handling, submission of specimen other than nasopharyngeal swab, presence of viral mutation(s) within the areas targeted by this assay, and inadequate number of viral copies(<138 copies/mL). A negative result must be combined with clinical observations, patient history, and epidemiological information. The expected result is Negative.  Fact Sheet for Patients:  BloggerCourse.com  Fact Sheet for Healthcare Providers:  SeriousBroker.it  This test is no t yet approved or cleared by the [place] FDA and  has been authorized for detection and/or diagnosis of SARS-CoV-2 by FDA under an Emergency Use Authorization (EUA). This EUA will remain  in effect (meaning this test can be used) for the duration of the COVID-19 declaration under Section 564(b)(1) of the Act, 21 U.S.C.section 360bbb-3(b)(1), unless the authorization is terminated  or revoked sooner.       Influenza A by PCR NEGATIVE NEGATIVE Final   Influenza B by PCR NEGATIVE NEGATIVE Final    Comment: (NOTE) The Xpert Xpress SARS-CoV-2/FLU/RSV plus assay is intended as an aid in the diagnosis of influenza from Nasopharyngeal swab specimens and should not be used as a sole basis for treatment. Nasal washings and aspirates are unacceptable for Xpert Xpress SARS-CoV-2/FLU/RSV testing.  Fact Sheet for Patients: BloggerCourse.com  Fact Sheet for Healthcare Providers: SeriousBroker.it  This test is not yet approved or cleared by the [place] FDA and has been authorized for detection and/or diagnosis of SARS-CoV-2 by FDA under an Emergency Use Authorization (EUA). This EUA will remain in effect (meaning this test can be used) for the duration of the COVID-19 declaration under Section 564(b)(1) of the Act, 21 U.S.C. section 360bbb-3(b)(1), unless the authorization is terminated or revoked.  Performed at [facility], [address], [place], [place] [address]     RADIOLOGY:  No results found.   CODE STATUS:     Code Status Orders  (From admission, onward)         Start     Ordered   12/20/20 1009  Full code  Continuous        12/20/20 1008        Code Status History    This patient has a current code status but no historical code status.   Advance Care Planning Activity    Advance Directive Documentation   Flowsheet Row Most Recent Value  Type of Advance Directive Healthcare Power of Attorney, Living will  Pre-existing out of facility DNR order (yellow form or pink MOST form) -  "MOST" Form in Place? -       TOTAL TIME TAKING CARE OF THIS PATIENT: *35* minutes.    Enedina Finner M.D  [facility]    CC: Primary care physician; Lupita Dawn, MD

## 2020-12-23 NOTE — TOC Transition Note (Addendum)
Transition of Care [facility]) - CM/SW Discharge Note   Patient Details  Name: Lauren Wheeler MRN: [id number] Date of Birth: 1927/10/04  [facility] CM/SW Contact:  Dominic Pea, LCSW Phone Number: 12/23/2020, 2:26 PM   Clinical Narrative:    Patient medically stable for discharge today. CSW spoke to patient's daughter-Lauren Wheeler via phone [phone]. Patient lives by herself, but daughter will be staying with patient as she recovers. PCP is Dr. Lupita Dawn with [facility]. Pharmacy is [facility] in [place]. No home health preference. CSW spoke with Christy Gentles at [facility] to arrange HHPT. Confirmed she is able to accept insurance. Dr. Allena Katz and RN Velna Hatchet updated. Patient has a walker. No additional DME needed. Daughter confirmed she provides transportation for patient to doctor's appointments and will be providing transportation home. No additional TOC needs at this time.   Final next level of care: Home w Home Health Services Barriers to Discharge: No Barriers Identified   Patient Goals and CMS Choice   CMS Medicare.gov Compare Post Acute Care list provided to:: Patient Represenative (must comment) (daughter-Lauren Wheeler) Choice offered to / list presented to : Adult Children  Discharge Placement                       Discharge Plan and Services In-house Referral: Clinical Social Work Discharge Planning Services: CM Consult Post Acute Care Choice: Home Health          DME Arranged: N/A DME Agency: NA       HH Arranged: PT HH Agency: [facility] Date HH Agency Contacted: 12/23/20 Time HH Agency Contacted: 1404 Representative spoke with at [facility] Agency: Christy Gentles  Social Determinants of Health (SDOH) Interventions     Readmission Risk Interventions No flowsheet data found.

## 2020-12-23 NOTE — Progress Notes (Signed)
Pt was discharged home with her daughter.  Discharge instructions were provided to the pt and her daughter. PIV and tele monitor discontinued.  Pt was discharged with all belongings and transported via wheelchair.

## 2020-12-24 LAB — BPAM RBC
Blood Product Expiration Date: 202203242359
Blood Product Expiration Date: 202203272359
Blood Product Expiration Date: 202203282359
Blood Product Expiration Date: 202203282359
ISSUE DATE / TIME: 202202241148
ISSUE DATE / TIME: 202202251713
Unit Type and Rh: 5100
Unit Type and Rh: 5100
Unit Type and Rh: 5100
Unit Type and Rh: 5100

## 2020-12-24 LAB — TYPE AND SCREEN
ABO/RH(D): O NEG
Antibody Screen: NEGATIVE
Unit division: 0
Unit division: 0
Unit division: 0
Unit division: 0

## 2021-01-14 ENCOUNTER — Ambulatory Visit: Payer: Medicare PPO | Admitting: Cardiovascular Disease

## 2021-11-29 ENCOUNTER — Other Ambulatory Visit: Payer: Self-pay

## 2021-11-29 ENCOUNTER — Emergency Department: Payer: Medicare PPO

## 2021-11-29 ENCOUNTER — Observation Stay
Admission: EM | Admit: 2021-11-29 | Discharge: 2021-11-30 | Disposition: A | Discharge status: Home / Self Care | Payer: Medicare PPO | Attending: Internal Medicine | Admitting: Internal Medicine

## 2021-11-29 DIAGNOSIS — I482 Chronic atrial fibrillation, unspecified: Secondary | ICD-10-CM | POA: Diagnosis present

## 2021-11-29 DIAGNOSIS — R0902 Hypoxemia: Secondary | ICD-10-CM

## 2021-11-29 DIAGNOSIS — Z20822 Contact with and (suspected) exposure to covid-19: Secondary | ICD-10-CM | POA: Insufficient documentation

## 2021-11-29 DIAGNOSIS — I10 Essential (primary) hypertension: Secondary | ICD-10-CM | POA: Diagnosis not present

## 2021-11-29 DIAGNOSIS — E785 Hyperlipidemia, unspecified: Secondary | ICD-10-CM | POA: Diagnosis not present

## 2021-11-29 DIAGNOSIS — Z79899 Other long term (current) drug therapy: Secondary | ICD-10-CM | POA: Insufficient documentation

## 2021-11-29 DIAGNOSIS — J9601 Acute respiratory failure with hypoxia: Principal | ICD-10-CM | POA: Diagnosis present

## 2021-11-29 DIAGNOSIS — M79605 Pain in left leg: Secondary | ICD-10-CM | POA: Diagnosis not present

## 2021-11-29 DIAGNOSIS — R0602 Shortness of breath: Secondary | ICD-10-CM | POA: Diagnosis present

## 2021-11-29 DIAGNOSIS — J849 Interstitial pulmonary disease, unspecified: Secondary | ICD-10-CM

## 2021-11-29 LAB — MAGNESIUM: Magnesium: 2.2 mg/dL (ref 1.7–2.4)

## 2021-11-29 LAB — TSH: TSH: 0.969 u[IU]/mL (ref 0.350–4.500)

## 2021-11-29 LAB — BASIC METABOLIC PANEL
Anion gap: 10 (ref 5–15)
BUN: 29 mg/dL — ABNORMAL HIGH (ref 8–23)
CO2: 25 mmol/L (ref 22–32)
Calcium: 9.7 mg/dL (ref 8.9–10.3)
Chloride: 103 mmol/L (ref 98–111)
Creatinine, Ser: 0.85 mg/dL (ref 0.44–1.00)
GFR, Estimated: >60 mL/min (ref >60)
Glucose, Bld: 106 mg/dL — ABNORMAL HIGH (ref 70–99)
Potassium: 4.4 mmol/L (ref 3.5–5.1)
Sodium: 138 mmol/L (ref 135–145)

## 2021-11-29 LAB — CBC
HCT: 42.6 % (ref 36.0–46.0)
Hemoglobin: 13.8 g/dL (ref 12.0–15.0)
MCH: 32.5 pg (ref 26.0–34.0)
MCHC: 32.4 g/dL (ref 30.0–36.0)
MCV: 100.2 fL — ABNORMAL HIGH (ref 80.0–100.0)
Platelets: 317 10*3/uL (ref 150–400)
RBC: 4.25 MIL/uL (ref 3.87–5.11)
RDW: 12.7 % (ref 11.5–15.5)
WBC: 7 10*3/uL (ref 4.0–10.5)
nRBC: 0 % (ref 0.0–0.2)

## 2021-11-29 LAB — TROPONIN I (HIGH SENSITIVITY)
Troponin I (High Sensitivity): 8 ng/L (ref <18)
Troponin I (High Sensitivity): 10 ng/L (ref <18)
Troponin I (High Sensitivity): 9 ng/L (ref <18)

## 2021-11-29 LAB — D-DIMER, QUANTITATIVE: D-Dimer, Quant: 0.71 ug/mL-FEU — ABNORMAL HIGH (ref 0.00–0.50)

## 2021-11-29 LAB — RESP PANEL BY RT-PCR (FLU A&B, COVID) ARPGX2
Influenza A by PCR: NEGATIVE
Influenza B by PCR: NEGATIVE
SARS Coronavirus 2 by RT PCR: NEGATIVE

## 2021-11-29 LAB — BRAIN NATRIURETIC PEPTIDE: B Natriuretic Peptide: 356.6 pg/mL — ABNORMAL HIGH (ref 0.0–100.0)

## 2021-11-29 MED ORDER — SODIUM CHLORIDE 0.9 % IV SOLN: 250.0000 mL | INTRAVENOUS | Status: DC | PRN

## 2021-11-29 MED ORDER — ONDANSETRON HCL 4 MG/2ML IJ SOLN: 4.0000 mg | Freq: Four times a day (QID) | INTRAMUSCULAR | Status: DC | PRN

## 2021-11-29 MED ORDER — ACETAMINOPHEN 650 MG RE SUPP: 650.0000 mg | Freq: Four times a day (QID) | RECTAL | Status: DC | PRN

## 2021-11-29 MED ORDER — SODIUM CHLORIDE 0.9 % IV SOLN: INTRAVENOUS | Status: DC

## 2021-11-29 MED ORDER — DILTIAZEM HCL-DEXTROSE 125-5 MG/125ML-% IV SOLN (PREMIX): 5.0000 mg/h | INTRAVENOUS | Status: DC

## 2021-11-29 MED ORDER — NICOTINE 14 MG/24HR TD PT24: 14.0000 mg | MEDICATED_PATCH | Freq: Every day | TRANSDERMAL | Status: DC

## 2021-11-29 MED ORDER — SODIUM CHLORIDE 0.9% FLUSH: 3.0000 mL | INTRAVENOUS | Status: DC | PRN

## 2021-11-29 MED ORDER — ACETAMINOPHEN 500 MG PO TABS
1000.0000 mg | ORAL_TABLET | Freq: Once | ORAL | Status: AC
  Administered 2021-11-29: 1000 mg via ORAL
  Filled 2021-11-29: qty 2

## 2021-11-29 MED ORDER — HYDRALAZINE HCL 20 MG/ML IJ SOLN: 5.0000 mg | INTRAMUSCULAR | Status: DC | PRN

## 2021-11-29 MED ORDER — ONDANSETRON HCL 4 MG PO TABS: 4.0000 mg | ORAL_TABLET | Freq: Four times a day (QID) | ORAL | Status: DC | PRN

## 2021-11-29 MED ORDER — ACETAMINOPHEN 325 MG PO TABS
650.0000 mg | ORAL_TABLET | Freq: Four times a day (QID) | ORAL | Status: DC | PRN
  Administered 2021-11-30: 650 mg via ORAL
  Filled 2021-11-29: qty 2

## 2021-11-29 MED ORDER — ALBUTEROL SULFATE HFA 108 (90 BASE) MCG/ACT IN AERS
2.0000 | INHALATION_SPRAY | RESPIRATORY_TRACT | Status: DC | PRN
  Filled 2021-11-29: qty 6.7

## 2021-11-29 MED ORDER — SODIUM CHLORIDE 0.9% FLUSH
3.0000 mL | Freq: Two times a day (BID) | INTRAVENOUS | Status: DC
  Administered 2021-11-29: 3 mL via INTRAVENOUS

## 2021-11-29 NOTE — Assessment & Plan Note (Addendum)
We had trouble obtaining her pulse ox secondary to clubbing.  Blood gas showed a PO2 of 95 at rest.  When she started moving around she needs started desaturating.  Just with sitting up her saturations desaturated into the 80s.  With 3 L desaturated to 78%.  Needed 4 L to maintain saturations with ambulation.  CT scan does not show any pulmonary embolism but did show interstitial lung disease.

## 2021-11-29 NOTE — Assessment & Plan Note (Addendum)
I restart her propranolol 40 mg twice a day.  Added Cardizem CD with heart rate going up with ambulation.  Not on any anticoagulation secondary to previous GI bleeds.

## 2021-11-29 NOTE — ED Triage Notes (Signed)
Pt to ED via POV from Dr. Rondel Baton office for SOB and new onset of afib. Pt reports she has been having increased SOB over the past few weeks. Pt also c/o bilateral leg pain specifically in the calf. Pt states this morning her PCP told her she was showing afib and does not have hx. Pt denies blood thinner use.  Pt oxygen sats 90%. Pt placed on 2L [place].

## 2021-11-29 NOTE — ED Notes (Signed)
Pt provided with sandwich tray.  

## 2021-11-29 NOTE — ED Provider Notes (Signed)
[facility] Provider Note    Event Date/Time   First MD Initiated Contact with Patient 11/29/21 1554     (approximate)   History   Shortness of Breath   HPI  Lauren HausenRuth W Danese is a 88 y.o. female with past medical history of hypertension, hyperlipidemia and essential tremor who presents with shortness of breath.  Patient notes that over the last several weeks she has become progressively winded with exertion.  She has also had some dull aching pain in the left calf.  She saw her primary care doctor today Dr. Hyacinth MeekerMiller who noted that she was in atrial fibrillation and was satting 88% on room air so told her to come to the emergency department.  Patient denies lower extremity edema.  She denies cough fevers chills or chest pain.  Eating and drinking fine without nausea vomiting.    Past Medical History:  Diagnosis Date   Essential tremor    HLD (hyperlipidemia)    HTN (hypertension)     Patient Active Problem List   Diagnosis Date Noted   SOB (shortness of breath) 11/29/2021   Acute respiratory failure with hypoxia (HCC) 11/29/2021   Left leg pain 11/29/2021   Acute gastric ulcer    GI bleeding 12/20/2020   Atrial fibrillation, chronic (HCC) 12/20/2020   Elevated INR 12/20/2020   Leukocytosis 12/20/2020   Elevated lactic acid level 12/20/2020   Blood loss anemia 12/20/2020   Abdominal pain 12/20/2020   Pressure injury of skin 12/20/2020   HTN (hypertension)    HLD (hyperlipidemia)    Essential tremor    Benign essential hypertension 11/08/2009     Physical Exam  Triage Vital Signs: ED Triage Vitals  Enc Vitals Group     BP 11/29/21 1545 (!) 146/110     Pulse Rate 11/29/21 1545 (!) 55     Resp 11/29/21 1545 18     Temp 11/29/21 1545 98 F (36.7 C)     Temp Source 11/29/21 1545 Oral     SpO2 11/29/21 1546 90 %     Weight 11/29/21 1541 150 lb (68 kg)     Height 11/29/21 1541 5\' 5"  (1.651 m)     Head Circumference --      Peak Flow --       Pain Score 11/29/21 1541 5     Pain Loc --      Pain Edu? --      Excl. in GC? --     Most recent vital signs: Vitals:   11/29/21 1800 11/29/21 1900  BP: (!) 139/91 135/79  Pulse: 88 95  Resp: 19 13  Temp:    SpO2: 99% 99%     General: Awake, no distress.  CV:  Good peripheral perfusion.  No lower extremity edema, no obvious calf swelling or no tenderness on palpation of the left calf, 2+ DP pulses bilaterally Resp:  Normal effort.  Crackles at the bases Abd:  No distention.  Neuro:             Awake, Alert, Oriented x 3  Other:     ED Results / Procedures / Treatments  Labs (all labs ordered are listed, but only abnormal results are displayed) Labs Reviewed  BASIC METABOLIC PANEL - Abnormal; Notable for the following components:      Result Value   Glucose, Bld 106 (*)    BUN 29 (*)    All other components within normal limits  CBC - Abnormal; Notable  for the following components:   MCV 100.2 (*)    All other components within normal limits  D-DIMER, QUANTITATIVE - Abnormal; Notable for the following components:   D-Dimer, Quant 0.71 (*)    All other components within normal limits  BRAIN NATRIURETIC PEPTIDE - Abnormal; Notable for the following components:   B Natriuretic Peptide 356.6 (*)    All other components within normal limits  RESP PANEL BY RT-PCR (FLU A&B, COVID) ARPGX2  MAGNESIUM  TROPONIN I (HIGH SENSITIVITY)  TROPONIN I (HIGH SENSITIVITY)  TROPONIN I (HIGH SENSITIVITY)     EKG  EKG interpreted by myself, atrial fibrillation, rate controlled, left axis deviation, no acute ischemic changes   RADIOLOGY I reviewed the patient's chest x-ray which shows cardiomegaly but no obvious infiltrate or pulmonary edema   PROCEDURES:  Critical Care performed: No  .1-3 Lead EKG Interpretation Performed by: Georga Hacking, MD Authorized by: Georga Hacking, MD     Interpretation: abnormal     ECG rate assessment: normal     Rhythm: atrial  fibrillation     Ectopy: none     Conduction: normal    The patient is on the cardiac monitor to evaluate for evidence of arrhythmia and/or significant heart rate changes.   MEDICATIONS ORDERED IN ED: Medications  acetaminophen (TYLENOL) tablet 1,000 mg (1,000 mg Oral Given 11/29/21 1805)     IMPRESSION / MDM / ASSESSMENT AND PLAN / ED COURSE  I reviewed the triage vital signs and the nursing notes.                              Differential diagnosis includes, but is not limited to, new onset CHF, arrhythmia induced, pneumonia, pulmonary embolism  Patient is a 88 year old female who presents with shortness of breath subacute as well as left calf pain.  Seen by her primary care doctor today who noted her to be hypoxic and in new onset A. fib.  On exam patient is requiring 2 L nasal cannula with sats in the high 90s.  She is not obviously winded.  Rate is around 100 and irregular on the monitor and EKG looks like atrial fibrillation that is relatively rate controlled without ischemic changes.  On exam patient has no lower extremity edema but I do hear crackles at the bases.  Plan to check labs including BNP and troponin.  Question whether this is new onset CHF however her lack of lower extreme edema makes me think otherwise.  Crackles could be from interstitial lung disease.  Given she is requiring oxygen will need admission.  Patient's chest x-ray shows cardiomegaly but no obvious infiltrate or pulmonary edema.  Her BNP is elevated at 350.  Troponin is negative.  Her D-dimer is 0.71, using the age-adjusted D-dimer this is negative.  The rest of her CBC and BMP are reassuring.  We will obtain a CT chest without contrast just to evaluate for other parenchymal abnormality.  CT chest without contrast does show some chronic interstitial lung disease but no pulmonary edema.  Given her oxygen requirement I did discuss with the hospitalist for admission.  Clinical Course as of 11/29/21 1930  Fri Nov 29, 2021  1647 B Natriuretic Peptide(!): 356.6 [KM]    Clinical Course User Index [KM] Georga Hacking, MD     FINAL CLINICAL IMPRESSION(S) / ED DIAGNOSES   Final diagnoses:  Hypoxia     Rx / DC Orders  ED Discharge Orders     None        Note:  This document was prepared using Dragon voice recognition software and may include unintentional dictation errors.   Georga Hacking, MD 11/29/21 [phone]

## 2021-11-29 NOTE — Assessment & Plan Note (Deleted)
Cont with supplemental oxygen. SpO2: 100 %

## 2021-11-29 NOTE — Assessment & Plan Note (Addendum)
Restarted on propranolol 40 mg twice a day.  I added Cardizem CD.

## 2021-11-29 NOTE — H&P (Signed)
History and Physical    Patient: Lauren HausenRuth W Wheeler WUJ:[id number] DOB: 02/09/1927 DOA: 11/29/2021 DOS: the patient was seen and examined on 11/29/2021 [facility]  Patient coming from: Home  Chief Complaint:  Chief Complaint  Patient presents with   Shortness of Breath    HPI: Lauren Wheeler is a 88 y.o. female with c/o sob and over past few weeks. Pt went to pcp for left leg pain. Pt also has c/o doe over past few months . Pt is not on any blood thinner.  Upon initial evaluation patient required oxygen at 2 L nasal cannula. left leg pain has been going on for a while off and on.    EGD 12/20/2020:- Normal esophagus. - Non-bleeding gastric ulcer with a visible vessel. Injected. Treated with bipolar cautery. - Clotted blood in the gastric fundus. - Normal examined duodenum. - No specimens collected.  ECHOCARDIOGRAM 12/20/2020:  1. Left ventricular ejection fraction, by estimation, is 60 to 65%. The  left ventricle has normal function. The left ventricle has no regional  wall motion abnormalities. Left ventricular diastolic parameters are  indeterminate.   2. Right ventricular systolic function is normal. The right ventricular  size is normal. There is normal pulmonary artery systolic pressure. The  estimated right ventricular systolic pressure is 32.5 mmHg.   3. Left atrial size was severely dilated.   4. Mild mitral valve regurgitation.   5. Rhythm is atrial fibrillation.  Review of Systems:  Review of Systems  Respiratory:  Positive for shortness of breath.   Musculoskeletal:  Positive for joint pain.       Left leg pain   All other systems reviewed and are negative.  Past Medical History:  Diagnosis Date   Essential tremor    HLD (hyperlipidemia)    HTN (hypertension)    Past Surgical History:  Procedure Laterality Date   APPENDECTOMY     COLONOSCOPY     dental procedure     ESOPHAGOGASTRODUODENOSCOPY N/A 12/20/2020   Procedure: ESOPHAGOGASTRODUODENOSCOPY  (EGD);  Surgeon: Regis BillLocklear, Cameron T, MD;  [facility];  Service: Endoscopy;  Laterality: N/A;   TONSILLECTOMY     TUBAL LIGATION     Social History:  reports that she has never smoked. She has never used smokeless tobacco. She reports that she does not drink alcohol and does not use drugs.  No Known Allergies  Family History  Problem Relation Age of Onset   Parkinson's disease Mother    Stroke Father     Prior to Admission medications   Medication Sig Start Date End Date Taking? Authorizing Provider  atorvastatin (LIPITOR) 10 MG tablet Take 10 mg by mouth daily. 12/11/20   [provider]  pantoprazole (PROTONIX) 40 MG tablet Take 1 tablet (40 mg total) by mouth 2 (two) times daily before a meal. 12/24/20   Enedina FinnerPatel, Sona, MD  propranolol (INDERAL) 40 MG tablet Take 40 mg by mouth 3 (three) times daily. 12/11/20   [provider]  traMADol (ULTRAM) 50 MG tablet Take 1 tablet (50 mg total) by mouth every 12 (twelve) hours as needed for severe pain. 12/23/20   Enedina FinnerPatel, Sona, MD  vitamin B-12 1000 MCG tablet Take 1 tablet (1,000 mcg total) by mouth daily. 12/24/20   Enedina FinnerPatel, Sona, MD    Physical Exam: Vitals:   11/29/21 1900 11/29/21 1930 11/29/21 2000 11/29/21 2030  BP: 135/79 (!) 145/91 (!) 146/101 (!) 146/110  Pulse: 95 96 69 88  Resp: 13 12 (!) 23 (!)  22  Temp:      TempSrc:      SpO2: 99% 97% 100% 100%  Weight:      Height:       Physical Exam Vitals reviewed.  Constitutional:      General: She is not in acute distress.    Appearance: She is not ill-appearing.  HENT:     Head: Normocephalic and atraumatic.     Right Ear: External ear normal.     Left Ear: External ear normal.     Nose: Nose normal.     Mouth/Throat:     Mouth: Mucous membranes are moist.  Eyes:     Extraocular Movements: Extraocular movements intact.     Pupils: Pupils are equal, round, and reactive to light.  Neck:     Vascular: No carotid bruit.  Cardiovascular:     Rate  and Rhythm: Normal rate and regular rhythm.     Pulses: Normal pulses.     Heart sounds: Normal heart sounds.  Pulmonary:     Effort: Pulmonary effort is normal.     Breath sounds: Normal breath sounds.  Abdominal:     General: Bowel sounds are normal.     Palpations: Abdomen is soft.  Musculoskeletal:     Cervical back: Normal range of motion and neck supple.     Right lower leg: No edema.     Left lower leg: No edema.  Neurological:     General: No focal deficit present.     Mental Status: She is alert and oriented to person, place, and time.  Psychiatric:        Mood and Affect: Mood normal.        Behavior: Behavior normal.     Labs: Bmp shows glucose of 106. BNP of 356.6 / TNI of 8. CBC shows 100.2. Dimer of 0.72.  Respiratory panel neg for covid.  CT chest without contrast: Neg for pneumonia/ infiltrate /pulmonary edema.    Assessment and Plan: * Left leg pain- (present on admission) Venous doppler negative for DVT. D/d  include arthritis or venous insufficiency.    Acute respiratory failure with hypoxia (HCC)- (present on admission) Due to Pe. Cont supp oxygen. We will obtain 2 d echo.   SOB (shortness of breath)- (present on admission) Cont with supplemental oxygen. SpO2: 100 %    HTN (hypertension)- (present on admission) Blood pressure (!) 146/110, pulse 88, temperature 98 F (36.7 C), temperature source Oral, resp. rate (!) 22, height 5\' 5"  (1.651 m), weight 68 kg, SpO2 100 %. we will continue Prn hydralazine and propranolol.   Atrial fibrillation, chronic (HCC)- (present on admission) Pt is  Rate controlled.  Cont propranolol. Pt is not any noac due to gastric ulcer and gi bleed.      Advance Care Planning:   Code Status: Full Code FULL CODE   Consults: None   Family Communication:CREECH, ANN D (Daughter)  [phone] (Home Phone)  Severity of Illness: The appropriate patient status for this patient is INPATIENT. Inpatient status is  judged to be reasonable and necessary in order to provide the required intensity of service to ensure the patient's safety. The patient's presenting symptoms, physical exam findings, and initial radiographic and laboratory data in the context of their chronic comorbidities is felt to place them at high risk for further clinical deterioration. Furthermore, it is not anticipated that the patient will be medically stable for discharge from the hospital within 2 midnights of admission.   * I  certify that at the point of admission it is my clinical judgment that the patient will require inpatient hospital care spanning beyond 2 midnights from the point of admission due to high intensity of service, high risk for further deterioration and high frequency of surveillance required.*  Author: Gertha Calkin, MD 11/29/2021 9:17 PM  For on call review [email or web address].

## 2021-11-29 NOTE — Assessment & Plan Note (Addendum)
The patient does have some leg pains at night and with moving around.  Could have an underlying restless leg syndrome but the patient did not want to try any medications for this.  The patient did have good pulses bilateral lower extremities so I do not think this is arterial embolization.  Lower extremity ultrasound negative for DVT.

## 2021-11-30 ENCOUNTER — Inpatient Hospital Stay: Payer: Medicare PPO

## 2021-11-30 DIAGNOSIS — J9601 Acute respiratory failure with hypoxia: Secondary | ICD-10-CM | POA: Diagnosis not present

## 2021-11-30 DIAGNOSIS — J849 Interstitial pulmonary disease, unspecified: Secondary | ICD-10-CM

## 2021-11-30 DIAGNOSIS — I10 Essential (primary) hypertension: Secondary | ICD-10-CM

## 2021-11-30 DIAGNOSIS — E785 Hyperlipidemia, unspecified: Secondary | ICD-10-CM

## 2021-11-30 DIAGNOSIS — I482 Chronic atrial fibrillation, unspecified: Secondary | ICD-10-CM | POA: Diagnosis not present

## 2021-11-30 DIAGNOSIS — M79605 Pain in left leg: Secondary | ICD-10-CM | POA: Diagnosis not present

## 2021-11-30 LAB — BLOOD GAS, ARTERIAL
Acid-Base Excess: 0.4 mmol/L (ref 0.0–2.0)
Bicarbonate: 22.9 mmol/L (ref 20.0–28.0)
FIO2: 0.21
O2 Saturation: 98 %
Patient temperature: 37
pCO2 arterial: 30 mmHg — ABNORMAL LOW (ref 32.0–48.0)
pH, Arterial: 7.49 — ABNORMAL HIGH (ref 7.350–7.450)
pO2, Arterial: 95 mmHg (ref 83.0–108.0)

## 2021-11-30 LAB — CBC
HCT: 36.3 % (ref 36.0–46.0)
Hemoglobin: 12.2 g/dL (ref 12.0–15.0)
MCH: 32.9 pg (ref 26.0–34.0)
MCHC: 33.6 g/dL (ref 30.0–36.0)
MCV: 97.8 fL (ref 80.0–100.0)
Platelets: 276 10*3/uL (ref 150–400)
RBC: 3.71 MIL/uL — ABNORMAL LOW (ref 3.87–5.11)
RDW: 12.5 % (ref 11.5–15.5)
WBC: 7 10*3/uL (ref 4.0–10.5)
nRBC: 0 % (ref 0.0–0.2)

## 2021-11-30 LAB — BASIC METABOLIC PANEL
Anion gap: 7 (ref 5–15)
BUN: 27 mg/dL — ABNORMAL HIGH (ref 8–23)
CO2: 24 mmol/L (ref 22–32)
Calcium: 9.3 mg/dL (ref 8.9–10.3)
Chloride: 106 mmol/L (ref 98–111)
Creatinine, Ser: 0.84 mg/dL (ref 0.44–1.00)
GFR, Estimated: >60 mL/min (ref >60)
Glucose, Bld: 110 mg/dL — ABNORMAL HIGH (ref 70–99)
Potassium: 4.1 mmol/L (ref 3.5–5.1)
Sodium: 137 mmol/L (ref 135–145)

## 2021-11-30 MED ORDER — PROPRANOLOL HCL 20 MG PO TABS
40.0000 mg | ORAL_TABLET | Freq: Two times a day (BID) | ORAL | Status: DC
  Administered 2021-11-30: 40 mg via ORAL
  Filled 2021-11-30: qty 2

## 2021-11-30 MED ORDER — ALBUTEROL SULFATE (2.5 MG/3ML) 0.083% IN NEBU: 3.0000 mL | INHALATION_SOLUTION | Freq: Four times a day (QID) | RESPIRATORY_TRACT | Status: DC | PRN

## 2021-11-30 MED ORDER — DILTIAZEM HCL ER COATED BEADS 120 MG PO CP24
120.0000 mg | ORAL_CAPSULE | Freq: Every day | ORAL | Status: DC
  Administered 2021-11-30: 120 mg via ORAL
  Filled 2021-11-30: qty 1

## 2021-11-30 MED ORDER — PANTOPRAZOLE SODIUM 40 MG PO TBEC
40.0000 mg | DELAYED_RELEASE_TABLET | Freq: Two times a day (BID) | ORAL | Status: DC
  Administered 2021-11-30: 40 mg via ORAL
  Filled 2021-11-30: qty 1

## 2021-11-30 MED ORDER — DILTIAZEM HCL ER COATED BEADS 120 MG PO CP24: 120.0000 mg | ORAL_CAPSULE | Freq: Every day | ORAL | 0 refills | Status: DC

## 2021-11-30 MED ORDER — IOHEXOL 350 MG/ML SOLN
75.0000 mL | Freq: Once | INTRAVENOUS | Status: AC | PRN
Start: 2021-11-30 — End: 2021-11-30
  Administered 2021-11-30: 75 mL via INTRAVENOUS

## 2021-11-30 MED ORDER — ALBUTEROL SULFATE HFA 108 (90 BASE) MCG/ACT IN AERS: 2.0000 | INHALATION_SPRAY | Freq: Four times a day (QID) | RESPIRATORY_TRACT | 0 refills | Status: DC | PRN

## 2021-11-30 MED ORDER — DILTIAZEM HCL ER COATED BEADS 120 MG PO CP24
120.0000 mg | ORAL_CAPSULE | Freq: Every day | ORAL | Status: DC
Start: 2021-11-30 — End: 2021-11-30

## 2021-11-30 NOTE — Assessment & Plan Note (Signed)
Continue atorvastatin

## 2021-11-30 NOTE — Progress Notes (Signed)
Provider notified of patient strip of A-fib and SOB

## 2021-11-30 NOTE — Care Management CC44 (Signed)
Condition Code 44 Documentation Completed  Patient Details  Name: Lauren Wheeler MRN: [id number] Date of Birth: 1926-12-09   Condition Code 44 given:  Yes Patient signature on Condition Code 44 notice:  Yes Documentation of 2 MD's agreement:  Yes Code 44 added to claim:  Yes    Reuel Boom Sarkis Rhines, LCSW 11/30/2021, 1:39 PM

## 2021-11-30 NOTE — Assessment & Plan Note (Signed)
Seen on CAT scan of the chest.  Likely the reason for her hypoxia with ambulation.  Will refer to pulmonary as outpatient.

## 2021-11-30 NOTE — Progress Notes (Signed)
Patient  transported in wheelchair to son's car.  Currently no signs of acute distress.

## 2021-11-30 NOTE — Discharge Instructions (Signed)
Oxygen 2 Liters at rest and sleeping. Turn oxygen up to 4 L with ambulation

## 2021-11-30 NOTE — Discharge Summary (Signed)
Physician Discharge Summary   Patient: Lauren Wheeler MRN: [id number] DOB: 1926/12/19  Admit date:     11/29/2021  Discharge date: 11/30/21  Discharge Physician: Alford Highland   PCP: Dr. Bethann Punches  Recommendations at discharge:   Wear oxygen 24/7, 2 L at rest and 4 L with ambulation Follow-up PCP 5 days Follow-up pulmonary 2 weeks  Discharge Diagnoses: Principal Problem:   Acute respiratory failure with hypoxia (HCC) Active Problems:   Interstitial lung disease (HCC)   Left leg pain   Atrial fibrillation, chronic (HCC)   HTN (hypertension)   Hyperlipidemia, unspecified    Hospital Course: The patient came into the hospital with shortness of breath and left leg pain.  The leg pain was described as a cramping can happen at night and with ambulation.  Shortness of breath more with ambulation.  Patient had a CT scan of the chest that showed no pulmonary embolism but did show interstitial lung disease.  The patient desaturated with ambulation and now requires oxygen to go home with.  The patient will have 2 L at rest and 4 L with ambulation.  Assessment and Plan: * Acute respiratory failure with hypoxia (HCC)- (present on admission) We had trouble obtaining her pulse ox secondary to clubbing.  Blood gas showed a PO2 of 95 at rest.  When she started moving around she needs started desaturating.  Just with sitting up her saturations desaturated into the 80s.  With 3 L desaturated to 78%.  Needed 4 L to maintain saturations with ambulation.  CT scan does not show any pulmonary embolism but did show interstitial lung disease.  Interstitial lung disease (HCC) Seen on CAT scan of the chest.  Likely the reason for her hypoxia with ambulation.  Will refer to pulmonary as outpatient.  Left leg pain- (present on admission) The patient does have some leg pains at night and with moving around.  Could have an underlying restless leg syndrome but the patient did not want to try any medications  for this.  The patient did have good pulses bilateral lower extremities so I do not think this is arterial embolization.  Lower extremity ultrasound negative for DVT.   Atrial fibrillation, chronic (HCC)- (present on admission) I restart her propranolol 40 mg twice a day.  Added Cardizem CD with heart rate going up with ambulation.  Not on any anticoagulation secondary to previous GI bleeds.   Hyperlipidemia, unspecified Continue atorvastatin  HTN (hypertension)- (present on admission) Restarted on propranolol 40 mg twice a day.  I added Cardizem CD.           Consultants: None Procedures performed: None Disposition: Home Diet recommendation:  Cardiac diet  DISCHARGE MEDICATION: Allergies as of 11/30/2021   No Known Allergies      Medication List     TAKE these medications    albuterol 108 (90 Base) MCG/ACT inhaler Commonly known as: VENTOLIN HFA Inhale 2 puffs into the lungs every 6 (six) hours as needed for wheezing or shortness of breath.   atorvastatin 10 MG tablet Commonly known as: LIPITOR Take 10 mg by mouth at bedtime.   cetirizine 5 MG tablet Commonly known as: ZYRTEC Take 5 mg by mouth daily as needed for allergies.   cyanocobalamin 1000 MCG tablet Take 1 tablet (1,000 mcg total) by mouth daily.   diltiazem 120 MG 24 hr capsule Commonly known as: CARDIZEM CD Take 1 capsule (120 mg total) by mouth daily.   Iron-Vitamin C 65-125 MG Tabs Take 1  tablet by mouth daily.   pantoprazole 40 MG tablet Commonly known as: PROTONIX Take 1 tablet (40 mg total) by mouth 2 (two) times daily before a meal. What changed: when to take this   propranolol 40 MG tablet Commonly known as: INDERAL Take 40 mg by mouth 2 (two) times daily.   traMADol 50 MG tablet Commonly known as: ULTRAM Take 1 tablet (50 mg total) by mouth every 12 (twelve) hours as needed for severe pain. What changed:  when to take this reasons to take this               Durable  Medical Equipment  (From admission, onward)           Start     Ordered   11/30/21 1535  For home use only DME oxygen  Once       Comments: 2 Liters at rest and 4 L with ambulation  Question Answer Comment  Length of Need Lifetime   Mode or (Route) Nasal cannula   Liters per Minute 2   Frequency Continuous (stationary and portable oxygen unit needed)   Oxygen conserving device Yes   Oxygen delivery system Gas      11/30/21 1534            Follow-up Information     Danella PentonMiller, Mark F, MD Follow up in 5 week(s).   Specialty: Internal Medicine Contact information: [phone] [facility] [address] [facility] [place] [address] [phone]         Vida RiggerAleskerov, Fuad, MD Follow up in 2 week(s).   Specialty: Pulmonary Disease Contact information: [address] [place] [address] [phone]                 Discharge Exam: Ceasar MonsFiled Weights   11/29/21 1541  Weight: 68 kg   Physical Exam HENT:     Head: Normocephalic.     Mouth/Throat:     Pharynx: No oropharyngeal exudate.  Eyes:     General: Lids are normal.     Conjunctiva/sclera: Conjunctivae normal.  Cardiovascular:     Rate and Rhythm: Normal rate. Rhythm irregularly irregular.     Pulses:          Popliteal pulses are 2+ on the left side.       Dorsalis pedis pulses are 2+ on the left side.       Posterior tibial pulses are 2+ on the left side.     Heart sounds: Normal heart sounds, S1 normal and S2 normal.  Pulmonary:     Breath sounds: Examination of the right-lower field reveals decreased breath sounds and rhonchi. Examination of the left-lower field reveals decreased breath sounds and rhonchi. Decreased breath sounds and rhonchi present. No wheezing or rales.  Abdominal:     Palpations: Abdomen is soft.     Tenderness: There is no abdominal tenderness.  Musculoskeletal:     Right lower leg: No swelling.     Left lower leg: No swelling.     Comments: Clubbing seen on  fingernails bilaterally  Skin:    General: Skin is warm.     Findings: No rash.  Neurological:     Mental Status: She is alert and oriented to person, place, and time.     Condition at discharge: stable  The results of significant diagnostics from this hospitalization (including imaging, microbiology, ancillary and laboratory) are listed below for reference.   Imaging Studies: DG Chest 2 View  Result Date: 11/29/2021  CLINICAL DATA:  Shortness of breath, new onset of atrial fibrillation. EXAM: CHEST - 2 VIEW COMPARISON:  Chest x-ray dated 11/26/2005. FINDINGS: Borderline cardiomegaly. Coarse lung markings bilaterally. No confluent opacity to suggest a developing pneumonia. No pleural effusion or pneumothorax is seen. No acute-appearing osseous abnormality, although characterization of osseous detail is limited by diffuse osteopenia. IMPRESSION: No active cardiopulmonary disease. No evidence of pneumonia or pulmonary edema. Electronically Signed   By: Bary Kooper Godshall M.D.   On: 11/29/2021 16:31   CT Chest Wo Contrast  Result Date: 11/29/2021 CLINICAL DATA:  Dyspnea, chronic, unclear etiology. EXAM: CT CHEST WITHOUT CONTRAST TECHNIQUE: Multidetector CT imaging of the chest was performed following the standard protocol without IV contrast. RADIATION DOSE REDUCTION: This exam was performed according to the departmental dose-optimization program which includes automated exposure control, adjustment of the mA and/or kV according to patient size and/or use of iterative reconstruction technique. COMPARISON:  None. FINDINGS: Cardiovascular: Cardiomegaly. No pericardial effusion. Aortic atherosclerosis. No thoracic aortic aneurysm. Three-vessel coronary artery calcifications. Mediastinum/Nodes: No mass or enlarged lymph nodes within the mediastinum. Esophagus is somewhat patulous but otherwise unremarkable. Trachea and central bronchi are unremarkable. Lungs/Pleura: Peripheral interlobular septal thickening  bilaterally, lower lobe predominant. Bilateral lower lobe bronchiectasis. No evidence of pneumonia. No evidence of pulmonary edema. No pleural effusion or pneumothorax. Upper Abdomen: Limited images of the upper abdomen are unremarkable. No acute findings. Musculoskeletal: No acute or suspicious osseous abnormality. Degenerative spondylosis of the kyphotic thoracic spine, moderate in degree. IMPRESSION: 1. No acute findings. No evidence of pneumonia or pulmonary edema. 2. Evidence of chronic interstitial lung disease, mild to moderate in degree, lower lobe predominant. 3. Bilateral lower lobe bronchiectasis, also mild to moderate in degree. 4. Cardiomegaly with three-vessel coronary artery calcifications. Aortic Atherosclerosis (ICD10-I70.0). Electronically Signed   By: Bary Avraham Benish M.D.   On: 11/29/2021 17:48   CT Angio Chest Pulmonary Embolism (PE) W or WO Contrast  Result Date: 11/30/2021 CLINICAL DATA:  Shortness of breath with elevated D-dimer. EXAM: CT ANGIOGRAPHY CHEST WITH CONTRAST TECHNIQUE: Multidetector CT imaging of the chest was performed using the standard protocol during bolus administration of intravenous contrast. Multiplanar CT image reconstructions and MIPs were obtained to evaluate the vascular anatomy. RADIATION DOSE REDUCTION: This exam was performed according to the departmental dose-optimization program which includes automated exposure control, adjustment of the mA and/or kV according to patient size and/or use of iterative reconstruction technique. CONTRAST:  75mL OMNIPAQUE IOHEXOL 350 MG/ML SOLN COMPARISON:  11/29/2021 FINDINGS: Cardiovascular: The heart is enlarged. Coronary artery calcification is evident. Moderate atherosclerotic calcification is noted in the wall of the thoracic aorta. There is no filling defect within the opacified pulmonary arteries to suggest the presence of an acute pulmonary embolus. Mediastinum/Nodes: No mediastinal lymphadenopathy. There is no hilar  lymphadenopathy. The esophagus has normal imaging features. There is no axillary lymphadenopathy. Lungs/Pleura: Chronic interstitial changes noted lung bases, stable since yesterday's study. No pulmonary edema or focal airspace consolidation. No pleural effusion Upper Abdomen: Subtle hypodensity lateral segment left liver stable since prior and comparing to abdomen CT 11/26/2005 consistent with benign etiology. Upper abdomen otherwise unremarkable. Musculoskeletal: No worrisome lytic or sclerotic osseous abnormality. Review of the MIP images confirms the above findings. IMPRESSION: 1. No CT evidence for acute pulmonary embolus. 2. Chronic interstitial changes in the lung bases. No new acute cardiopulmonary findings. 3. Aortic Atherosclerosis (ICD10-I70.0). Electronically Signed   By: Kennith Center M.D.   On: 11/30/2021 11:05   US Venous Img Lower Bilateral  Result Date: 11/29/2021 CLINICAL DATA:  Pain. EXAM: Bilateral LOWER EXTREMITY VENOUS DOPPLER ULTRASOUND TECHNIQUE: Gray-scale sonography with compression, as well as color and duplex ultrasound, were performed to evaluate the deep venous system(s) from the level of the common femoral vein through the popliteal and proximal calf veins. COMPARISON:  None. FINDINGS: VENOUS Normal compressibility of the bilateral common femoral, superficial femoral, and popliteal veins, as well as the visualized calf veins. Visualized portions of profunda femoral vein and great saphenous vein unremarkable. No filling defects to suggest DVT on grayscale or color Doppler imaging. Doppler waveforms show normal direction of venous flow, normal respiratory plasticity and response to augmentation. OTHER None. Limitations: none IMPRESSION: No evidence of acute deep venous thrombosis in the visualized veins of the lower extremities. Electronically Signed   By: Burman Nieves M.D.   On: 11/29/2021 20:29    Microbiology: Results for orders placed or performed during the hospital  encounter of 11/29/21  Resp Panel by RT-PCR (Flu A&B, Covid) Nasopharyngeal Swab     Status: None   Collection Time: 11/29/21  4:17 PM   Specimen: Nasopharyngeal Swab; Nasopharyngeal(NP) swabs in vial transport medium  Result Value Ref Range Status   SARS Coronavirus 2 by RT PCR NEGATIVE NEGATIVE Final    Comment: (NOTE) SARS-CoV-2 target nucleic acids are NOT DETECTED.  The SARS-CoV-2 RNA is generally detectable in upper respiratory specimens during the acute phase of infection. The lowest concentration of SARS-CoV-2 viral copies this assay can detect is 138 copies/mL. A negative result does not preclude SARS-Cov-2 infection and should not be used as the sole basis for treatment or other patient management decisions. A negative result may occur with  improper specimen collection/handling, submission of specimen other than nasopharyngeal swab, presence of viral mutation(s) within the areas targeted by this assay, and inadequate number of viral copies(<138 copies/mL). A negative result must be combined with clinical observations, patient history, and epidemiological information. The expected result is Negative.  Fact Sheet for Patients:  BloggerCourse.com  Fact Sheet for Healthcare Providers:  SeriousBroker.it  This test is no t yet approved or cleared by the [place] FDA and  has been authorized for detection and/or diagnosis of SARS-CoV-2 by FDA under an Emergency Use Authorization (EUA). This EUA will remain  in effect (meaning this test can be used) for the duration of the COVID-19 declaration under Section 564(b)(1) of the Act, 21 U.S.C.section 360bbb-3(b)(1), unless the authorization is terminated  or revoked sooner.       Influenza A by PCR NEGATIVE NEGATIVE Final   Influenza B by PCR NEGATIVE NEGATIVE Final    Comment: (NOTE) The Xpert Xpress SARS-CoV-2/FLU/RSV plus assay is intended as an aid in the diagnosis of  influenza from Nasopharyngeal swab specimens and should not be used as a sole basis for treatment. Nasal washings and aspirates are unacceptable for Xpert Xpress SARS-CoV-2/FLU/RSV testing.  Fact Sheet for Patients: BloggerCourse.com  Fact Sheet for Healthcare Providers: SeriousBroker.it  This test is not yet approved or cleared by the [place] FDA and has been authorized for detection and/or diagnosis of SARS-CoV-2 by FDA under an Emergency Use Authorization (EUA). This EUA will remain in effect (meaning this test can be used) for the duration of the COVID-19 declaration under Section 564(b)(1) of the Act, 21 U.S.C. section 360bbb-3(b)(1), unless the authorization is terminated or revoked.  Performed at [facility], [address] [place], [place] [address]     Labs: CBC: Recent Labs  Lab 11/29/21 1544 11/30/21 [id number]  WBC 7.0 7.0  HGB 13.8 12.2  HCT 42.6 36.3  MCV 100.2* 97.8  PLT 317 276   Basic Metabolic Panel: Recent Labs  Lab 11/29/21 1544 11/30/21 0527  NA 138 137  K 4.4 4.1  CL 103 106  CO2 25 24  GLUCOSE 106* 110*  BUN 29* 27*  CREATININE 0.85 0.84  CALCIUM 9.7 9.3  MG 2.2  --      Discharge time spent: greater than 30 minutes.  Signed: Alford Highlandichard Raffaele Derise, MD [facility] 11/30/2021

## 2021-11-30 NOTE — Progress Notes (Signed)
SATURATION QUALIFICATIONS: (This note is used to comply with regulatory documentation for home oxygen)  Patient Saturations on Room Air at Rest = 84%  Patient Saturations on Room Air while Ambulating = 73%  Patient Saturations on 3 Liters of oxygen while Ambulating = 78%  Patient Saturations on 4 Liters of oxygen while Ambulating = 100%  Please briefly explain why patient needs home oxygen: low oxygen saturation while resting and ambulating without supplemental.

## 2022-07-17 ENCOUNTER — Ambulatory Visit: Payer: Medicare PPO | Admitting: Dermatology

## 2022-07-17 DIAGNOSIS — L219 Seborrheic dermatitis, unspecified: Secondary | ICD-10-CM

## 2022-07-17 DIAGNOSIS — L720 Epidermal cyst: Secondary | ICD-10-CM | POA: Diagnosis not present

## 2022-07-17 DIAGNOSIS — L820 Inflamed seborrheic keratosis: Secondary | ICD-10-CM | POA: Diagnosis not present

## 2022-07-17 DIAGNOSIS — L570 Actinic keratosis: Secondary | ICD-10-CM

## 2022-07-17 DIAGNOSIS — L309 Dermatitis, unspecified: Secondary | ICD-10-CM

## 2022-07-17 DIAGNOSIS — L853 Xerosis cutis: Secondary | ICD-10-CM

## 2022-07-17 MED ORDER — KETOCONAZOLE 2 % EX SHAM: MEDICATED_SHAMPOO | CUTANEOUS | 3 refills | Status: DC

## 2022-07-17 MED ORDER — CLOBETASOL PROPIONATE 0.05 % EX SOLN: CUTANEOUS | 2 refills | Status: DC

## 2022-07-17 NOTE — Progress Notes (Signed)
New Patient Visit  Subjective  Lauren Wheeler is a 88 y.o. female who presents for the following: lesions (Areas of the face, left side of nose. Rubbed, irritated by glasses) and Pruritis (Neck, arms, torso, scalp. Uses Curel Itch Defense, Neutrogena Healthy scalp, Sarna lotion. Has used Mometasone solution, caused burning, made itching worse).  Patient accompanied by daughter who contributes to history  Review of Systems: No other skin or systemic complaints except as noted in HPI or Assessment and Plan.   Objective  Well appearing patient in no apparent distress; mood and affect are within normal limits.  A focused examination was performed including face, neck, arms. back. Relevant physical exam findings are noted in the Assessment and Plan.  Left Nasal Sidewall x1 Erythematous keratotic or waxy stuck-on papule or plaque.  Left Nasal Sidewall (hypertrophic) x1, right cheek x3, right forehead x1, left temple x1, left jaw x1 (7) Erythematous thin papules/macules with gritty scale.   Right Nasal Sidewall Subcutaneous nodule.   Neck, back Moderate xerosis, erythema at neck  B/L Ears, scalp Pink patches with greasy scale.    Assessment & Plan  Inflamed seborrheic keratosis Left Nasal Sidewall x1  Symptomatic, irritating, patient would like treated.  Destruction of lesion - Left Nasal Sidewall x1  Destruction method: cryotherapy   Informed consent: discussed and consent obtained   Lesion destroyed using liquid nitrogen: Yes   Region frozen until ice ball extended beyond lesion: Yes   Outcome: patient tolerated procedure well with no complications   Post-procedure details: wound care instructions given   Additional details:  Prior to procedure, discussed risks of blister formation, small wound, skin dyspigmentation, or rare scar following cryotherapy. Recommend Vaseline ointment to treated areas while healing.   AK (actinic keratosis) (7) Left Nasal Sidewall  (hypertrophic) x1, right cheek x3, right forehead x1, left temple x1, left jaw x1  Actinic keratoses are precancerous spots that appear secondary to cumulative UV radiation exposure/sun exposure over time. They are chronic with expected duration over 1 year. A portion of actinic keratoses will progress to squamous cell carcinoma of the skin. It is not possible to reliably predict which spots will progress to skin cancer and so treatment is recommended to prevent development of skin cancer.  Recommend daily broad spectrum sunscreen SPF 30+ to sun-exposed areas, reapply every 2 hours as needed.  Recommend staying in the shade or wearing long sleeves, sun glasses (UVA+UVB protection) and wide brim hats (4-inch brim around the entire circumference of the hat). Call for new or changing lesions.  Destruction of lesion - Left Nasal Sidewall (hypertrophic) x1, right cheek x3, right forehead x1, left temple x1, left jaw x1  Destruction method: cryotherapy   Informed consent: discussed and consent obtained   Lesion destroyed using liquid nitrogen: Yes   Region frozen until ice ball extended beyond lesion: Yes   Outcome: patient tolerated procedure well with no complications   Post-procedure details: wound care instructions given   Additional details:  Prior to procedure, discussed risks of blister formation, small wound, skin dyspigmentation, or rare scar following cryotherapy. Recommend Vaseline ointment to treated areas while healing.   Epidermal inclusion cyst Right Nasal Sidewall  Benign-appearing. Exam most consistent with an epidermal inclusion cyst. Discussed that a cyst is a benign growth that can grow over time and sometimes get irritated or inflamed. Recommend observation if it is not bothersome. Discussed option of surgical excision to remove it if it is growing, symptomatic, or other changes noted. Please call  for new or changing lesions so they can be evaluated.    Eczema, unspecified  type Neck, back  Pruritis, xerosis  Eczema Skin Care  Buy TWO 16oz jars of CeraVe moisturizing cream  [facility], [facility], [facility] (no prescription needed)  Costs about $15 per jar   Jar #1: Use as a moisturizer as needed. Can be applied to any area of the body. Use twice daily to unaffected areas.  Jar #2: Pour one 35ml bottle of clobetasol 0.05% solution into jar, mix well. Label this jar to indicate the medication has been added. Use twice daily to affected areas. Do not apply to face, groin or underarms.  Moisturizer may burn or sting initially. Try for at least 4 weeks.    Recommend OTC Gold Bond Rapid Relief Anti-Itch cream (pramoxine + menthol), CeraVe Anti-itch cream or lotion (pramoxine), Sarna lotion (Original- menthol + camphor or Sensitive- pramoxine) or Eucerin 12 hour Itch Relief lotion (menthol) up to 3 times per day to areas on body that are itchy.  Avoid taking Benadryl. Increased risk of falls.   Recommend taking Allegra 180mg  (fexofinadine) OR Claritin 10mg  (loratadine) OR Zyrtec 5mg  (cetirizine) as directed on label  clobetasol (TEMOVATE) 0.05 % external solution - Neck, back Apply twice daily to affected body areas as directed. Mixed with CeraVe cream  Seborrheic dermatitis B/L Ears, scalp  Chronic and persistent condition with duration or expected duration over one year. Condition is symptomatic / bothersome to patient. Not to goal.  Continue Fluocinolone oil as directed by ENT.   Start Ketoconazole 2% shampoo 2-3 times per week lather on scalp and ears, rinse well.  ketoconazole (NIZORAL) 2 % shampoo - B/L Ears, scalp 2-3 times per week lather on scalp and ears, rinse well.   Return for AK Follow Up, Itch Follow Up 4-8 weeks.  I, Emelia Salisbury, CMA, am acting as scribe for Forest Gleason, MD.  Documentation: I have reviewed the above documentation for accuracy and completeness, and I agree with the above.  Forest Gleason, MD

## 2022-07-17 NOTE — Patient Instructions (Addendum)
Cryotherapy Aftercare  Wash gently with soap and water everyday.   Apply Vaseline daily until healed.    Dandruff Scalp/Ears:   Continue Fluocinolone oil as directed by ENT.   Start Ketoconazole 2% shampoo 2-3 times per week lather on scalp and ears, rinse well.    Eczema Skin Care  Buy TWO 16oz jars of CeraVe moisturizing cream  [facility], [facility], [facility] (no prescription needed)  Costs about $15 per jar   Jar #1: Use as a moisturizer as needed. Can be applied to any area of the body. Use twice daily to unaffected areas.  Jar #2: Pour one 26ml bottle of clobetasol 0.05% solution into jar, mix well. Label this jar to indicate the medication has been added. Use twice daily to affected areas. Do not apply to face, groin or underarms.  Moisturizer may burn or sting initially. Try for at least 4 weeks.     Recommend OTC Gold Bond Rapid Relief Anti-Itch cream (pramoxine + menthol), CeraVe Anti-itch cream or lotion (pramoxine), Sarna lotion (Original- menthol + camphor or Sensitive- pramoxine) or Eucerin 12 hour Itch Relief lotion (menthol) up to 3 times per day to areas on body that are itchy.    Gentle Skin Care Guide  1. Bathe no more than once a day.  2. Avoid bathing in hot water  3. Use a mild soap like Dove, Vanicream, Cetaphil, CeraVe. Can use Lever 2000 or Cetaphil antibacterial soap  4. Use soap only where you need it. On most days, use it under your arms, between your legs, and on your feet. Let the water rinse other areas unless visibly dirty.  5. When you get out of the bath/shower, use a towel to gently blot your skin dry, don't rub it.  6. While your skin is still a little damp, apply a moisturizing cream such as Vanicream, CeraVe, Cetaphil, Eucerin, Sarna lotion or plain Vaseline Jelly. For hands apply Neutrogena [place] Hand Cream or Excipial Hand Cream.  7. Reapply moisturizer any time you start to itch or feel dry.  8. Sometimes using free and clear laundry  detergents can be helpful. Fabric softener sheets should be avoided. Downy Free & Gentle liquid, or any liquid fabric softener that is free of dyes and perfumes, it acceptable to use  9. If your doctor has given you prescription creams you may apply moisturizers over them     Due to recent changes in healthcare laws, you may see results of your pathology and/or laboratory studies on MyChart before the doctors have had a chance to review them. We understand that in some cases there may be results that are confusing or concerning to you. Please understand that not all results are received at the same time and often the doctors may need to interpret multiple results in order to provide you with the best plan of care or course of treatment. Therefore, we ask that you please give [place] 2 business days to thoroughly review all your results before contacting the office for clarification. Should we see a critical lab result, you will be contacted sooner.   If You Need Anything After Your Visit  If you have any questions or concerns for your doctor, please call our main line at [phone] and press option 4 to reach your doctor's medical assistant. If no one answers, please leave a voicemail as directed and we will return your call as soon as possible. Messages left after 4 pm will be answered the following business day.   You  may also send [place] a message via [place]. We typically respond to MyChart messages within 1-2 business days.  For prescription refills, please ask your pharmacy to contact our office. Our fax number is [phone].  If you have an urgent issue when the clinic is closed that cannot wait until the next business day, you can page your doctor at the number below.    Please note that while we do our best to be available for urgent issues outside of office hours, we are not available 24/7.   If you have an urgent issue and are unable to reach [place], you may choose to seek medical care at your  doctor's office, retail clinic, urgent care center, or emergency room.  If you have a medical emergency, please immediately call 911 or go to the emergency department.  Pager Numbers  - Dr. Nehemiah Massed: [phone]  - Dr. Laurence Ferrari: [phone]  - Dr. Nicole Kindred: [phone]  In the event of inclement weather, please call our main line at [phone] for an update on the status of any delays or closures.  Dermatology Medication Tips: Please keep the boxes that topical medications come in in order to help keep track of the instructions about where and how to use these. Pharmacies typically print the medication instructions only on the boxes and not directly on the medication tubes.   If your medication is too expensive, please contact our office at [phone] option 4 or send [place] a message through [place].   We are unable to tell what your co-pay for medications will be in advance as this is different depending on your insurance coverage. However, we may be able to find a substitute medication at lower cost or fill out paperwork to get insurance to cover a needed medication.   If a prior authorization is required to get your medication covered by your insurance company, please allow [place] 1-2 business days to complete this process.  Drug prices often vary depending on where the prescription is filled and some pharmacies may offer cheaper prices.  The website www.goodrx.com contains coupons for medications through different pharmacies. The prices here do not account for what the cost may be with help from insurance (it may be cheaper with your insurance), but the website can give you the price if you did not use any insurance.  - You can print the associated coupon and take it with your prescription to the pharmacy.  - You may also stop by our office during regular business hours and pick up a GoodRx coupon card.  - If you need your prescription sent electronically to a different pharmacy, notify  our office through [facility] or by phone at [phone] option 4.     Si Usted Necesita Algo Despus de Su Visita  Tambin puede enviarnos un mensaje a travs de Pharmacist, community. Por lo general respondemos a los mensajes de MyChart en el transcurso de 1 a 2 das hbiles.  Para renovar recetas, por favor pida a su farmacia que se ponga en contacto con nuestra oficina. Harland Dingwall de fax es [place] [phone].  Si tiene un asunto urgente cuando la clnica est cerrada y que no puede esperar hasta el siguiente da hbil, puede llamar/localizar a su doctor(a) al nmero que aparece a continuacin.   Por favor, tenga en cuenta que aunque hacemos todo lo posible para estar disponibles para asuntos urgentes fuera del horario de [place], no estamos disponibles las 24 horas del da, los 7 das de la [place].  Si tiene un problema urgente y no puede comunicarse con nosotros, puede optar por buscar atencin mdica  en el consultorio de su doctor(a), en una clnica privada, en un centro de atencin urgente o en una sala de emergencias.  Si tiene Engineering geologist, por favor llame inmediatamente al 911 o vaya a la sala de emergencias.  Nmeros de bper  - Dr. Nehemiah Massed: [phone]  - Dra. Moye: [phone]  - Dra. Nicole Kindred: [phone]  En caso de inclemencias del [place], por favor llame a Johnsie Kindred principal al [phone] para una actualizacin sobre el [place] de cualquier retraso o cierre.  Consejos para la medicacin en dermatologa: Por favor, guarde las cajas en las que vienen los medicamentos de uso tpico para ayudarle a seguir las instrucciones sobre dnde y cmo usarlos. Las farmacias generalmente imprimen las instrucciones del medicamento slo en las cajas y no directamente en los tubos del [place].   Si su medicamento es muy caro, por favor, pngase en contacto con Zigmund Daniel llamando al [phone] y presione la opcin 4 o envenos un mensaje a travs de  Pharmacist, community.   No podemos decirle cul ser su copago por los medicamentos por adelantado ya que esto es diferente dependiendo de la cobertura de su seguro. Sin embargo, es posible que podamos encontrar un medicamento sustituto a Electrical engineer un formulario para que el seguro cubra el medicamento que se considera necesario.   Si se requiere una autorizacin previa para que su compaa de seguros [place] su medicamento, por favor permtanos de 1 a 2 das hbiles para completar este proceso.  Los precios de los medicamentos varan con frecuencia dependiendo del Environmental consultant de dnde se surte la receta y alguna farmacias pueden ofrecer precios ms baratos.  El sitio web www.goodrx.com tiene cupones para medicamentos de Airline pilot. Los precios aqu no tienen en cuenta lo que podra costar con la ayuda del seguro (puede ser ms barato con su seguro), pero el sitio web puede darle el precio si no utiliz Research scientist (physical sciences).  - Puede imprimir el cupn correspondiente y llevarlo con su receta a la farmacia.  - Tambin puede pasar por nuestra oficina durante el horario de atencin regular y Charity fundraiser una tarjeta de cupones de GoodRx.  - Si necesita que su receta se enve electrnicamente a una farmacia diferente, informe a nuestra oficina a travs de MyChart de [place] o por telfono llamando al [phone] y presione la opcin 4.

## 2022-07-22 ENCOUNTER — Encounter: Payer: Self-pay | Admitting: Dermatology

## 2022-09-03 ENCOUNTER — Ambulatory Visit: Payer: Medicare PPO | Admitting: Dermatology

## 2022-09-03 DIAGNOSIS — L578 Other skin changes due to chronic exposure to nonionizing radiation: Secondary | ICD-10-CM | POA: Diagnosis not present

## 2022-09-03 DIAGNOSIS — L309 Dermatitis, unspecified: Secondary | ICD-10-CM

## 2022-09-03 DIAGNOSIS — L219 Seborrheic dermatitis, unspecified: Secondary | ICD-10-CM | POA: Diagnosis not present

## 2022-09-03 DIAGNOSIS — L570 Actinic keratosis: Secondary | ICD-10-CM

## 2022-09-03 MED ORDER — CLOBETASOL PROPIONATE 0.05 % EX SOLN: CUTANEOUS | 1 refills | Status: DC

## 2022-09-03 MED ORDER — FLUOROURACIL 5 % EX CREA: TOPICAL_CREAM | Freq: Two times a day (BID) | CUTANEOUS | 0 refills | Status: DC

## 2022-09-03 NOTE — Progress Notes (Signed)
Follow-Up Visit   Subjective  Lauren Wheeler is a 88 y.o. female who presents for the following: Follow-up (Patient here today for 6 week AK follow up. ) and Eczema (Patient using clobetasol/cerave mix for itch/eczema twice daily as needed, itch improved.).  Patient accompanied by daughter who contributes to history.   The following portions of the chart were reviewed this encounter and updated as appropriate:   Tobacco  Allergies  Meds  Problems  Med Hx  Surg Hx  Fam Hx      Review of Systems:  No other skin or systemic complaints except as noted in HPI or Assessment and Plan.  Objective  Well appearing patient in no apparent distress; mood and affect are within normal limits.  A focused examination was performed including face, scalp, hands. Relevant physical exam findings are noted in the Assessment and Plan.  trunk, extremities Clear today  Scalp Scaly erythematous plaques at scalp  left cheek under eyelid, right forehead, right temple (3) Erythematous thin papules/macules with gritty scale.     Assessment & Plan  Eczema, unspecified type trunk, extremities  May continue clobetasol/CeraVe mix 1-2 times daily as needed for itching. Avoid applying to face, groin, and axilla. Use as directed. Long-term use can cause thinning of the skin. When itch controlled use CeraVe only.   Topical steroids (such as triamcinolone, fluocinolone, fluocinonide, mometasone, clobetasol, halobetasol, betamethasone, hydrocortisone) can cause thinning and lightening of the skin if they are used for too long in the same area. Your physician has selected the right strength medicine for your problem and area affected on the body. Please use your medication only as directed by your physician to prevent side effects.    Related Medications clobetasol (TEMOVATE) 0.05 % external solution Apply twice daily to affected body areas as directed. Mixed with CeraVe cream  Seborrheic  dermatitis Scalp  Start clobetasol 0.05% solution to spot treat itch at scalp once daily as needed. Avoid applying to face, groin, and axilla. Use as directed. Long-term use can cause thinning of the skin.  Topical steroids (such as triamcinolone, fluocinolone, fluocinonide, mometasone, clobetasol, halobetasol, betamethasone, hydrocortisone) can cause thinning and lightening of the skin if they are used for too long in the same area. Your physician has selected the right strength medicine for your problem and area affected on the body. Please use your medication only as directed by your physician to prevent side effects.   Continue ketoconazole shampoo apply three times per week, massage into scalp and leave in for 10 minutes before rinsing out   clobetasol (TEMOVATE) 0.05 % external solution - Scalp Spot treat areas at scalp as needed for itch once daily. Avoid applying to face, groin, and axilla. Use as directed. Long-term use can cause thinning of the skin.  Related Medications ketoconazole (NIZORAL) 2 % shampoo 2-3 times per week lather on scalp and ears, rinse well.  AK (actinic keratosis) (3) left cheek under eyelid, right forehead, right temple  Hypertrophic  Actinic keratoses are precancerous spots that appear secondary to cumulative UV radiation exposure/sun exposure over time. They are chronic with expected duration over 1 year. A portion of actinic keratoses will progress to squamous cell carcinoma of the skin. It is not possible to reliably predict which spots will progress to skin cancer and so treatment is recommended to prevent development of skin cancer.  Recommend daily broad spectrum sunscreen SPF 30+ to sun-exposed areas, reapply every 2 hours as needed.  Recommend staying in the shade or  wearing long sleeves, sun glasses (UVA+UVB protection) and wide brim hats (4-inch brim around the entire circumference of the hat). Call for new or changing lesions.  Prior to  procedure, discussed risks of blister formation, small wound, skin dyspigmentation, or rare scar following cryotherapy. Recommend Vaseline ointment to treated areas while healing.   Destruction of lesion - left cheek under eyelid, right forehead, right temple  Destruction method: cryotherapy   Informed consent: discussed and consent obtained   Lesion destroyed using liquid nitrogen: Yes   Cryotherapy cycles:  2 Outcome: patient tolerated procedure well with no complications   Post-procedure details: wound care instructions given    Related Medications fluorouracil (EFUDEX) 5 % cream Apply topically 2 (two) times daily. For 7 days to nose as directed   Actinic Damage with PreCancerous Actinic Keratoses Counseling for Topical Chemotherapy Management: Patient exhibits: - Severe, confluent actinic changes with pre-cancerous actinic keratoses that is secondary to cumulative UV radiation exposure over time - Condition that is severe; chronic, not at goal. - diffuse scaly erythematous macules and papules with underlying dyspigmentation - Discussed Prescription "Field Treatment" topical Chemotherapy for Severe, Chronic Confluent Actinic Changes with Pre-Cancerous Actinic Keratoses Field treatment involves treatment of an entire area of skin that has confluent Actinic Changes (Sun/ Ultraviolet light damage) and PreCancerous Actinic Keratoses by method of PhotoDynamic Therapy (PDT) and/or prescription Topical Chemotherapy agents such as 5-fluorouracil, 5-fluorouracil/calcipotriene, and/or imiquimod.  The purpose is to decrease the number of clinically evident and subclinical PreCancerous lesions to prevent progression to development of skin cancer by chemically destroying early precancer changes that may or may not be visible.  It has been shown to reduce the risk of developing skin cancer in the treated area. As a result of treatment, redness, scaling, crusting, and open sores may occur during  treatment course. One or more than one of these methods may be used and may have to be used several times to control, suppress and eliminate the PreCancerous changes. Discussed treatment course, expected reaction, and possible side effects. - Recommend daily broad spectrum sunscreen SPF 30+ to sun-exposed areas, reapply every 2 hours as needed.  - Staying in the shade or wearing long sleeves, sun glasses (UVA+UVB protection) and wide brim hats (4-inch brim around the entire circumference of the hat) are also recommended. - Call for new or changing lesions. - Start 5-fluorouracil cream twice a day for 7 days to affected areas including nose.  Reviewed course of treatment and expected reaction.  Patient advised to expect inflammation and crusting and advised that erosions are possible.  Patient advised to be diligent with sun protection during and after treatment. Handout with details of how to apply medication and what to expect provided. Counseled to keep medication out of reach of children and pets.  Return for AK follow up 4-6 months.  Anise Salvo, RMA, am acting as scribe for Darden Dates, MD .   Documentation: I have reviewed the above documentation for accuracy and completeness, and I agree with the above.  Darden Dates, MD

## 2022-09-03 NOTE — Patient Instructions (Addendum)
May continue clobetasol/CeraVe mix 1-2 times daily as needed for itching. Avoid applying to face, groin, and axilla. Use as directed. Long-term use can cause thinning of the skin. When itch controlled use CeraVe only.   Topical steroids (such as triamcinolone, fluocinolone, fluocinonide, mometasone, clobetasol, halobetasol, betamethasone, hydrocortisone) can cause thinning and lightening of the skin if they are used for too long in the same area. Your physician has selected the right strength medicine for your problem and area affected on the body. Please use your medication only as directed by your physician to prevent side effects.   Start clobetasol 0.05% solution to spot treat itch at scalp once daily as needed. Avoid applying to face, groin, and axilla. Use as directed. Long-term use can cause thinning of the skin.  Cryotherapy Aftercare  Wash gently with soap and water everyday.   Apply Vaseline and Band-Aid daily until healed.    - Start 5-fluorouracil cream twice a day for 7 days to affected areas including nose.  Reviewed course of treatment and expected reaction.  Patient advised to expect inflammation and crusting and advised that erosions are possible.  Patient advised to be diligent with sun protection during and after treatment. Handout with details of how to apply medication and what to expect provided. Counseled to keep medication out of reach of children and pets.  Reviewed course of treatment and expected reaction.  Patient advised to expect inflammation and crusting and advised that erosions are possible.  Patient advised to be diligent with sun protection during and after treatment. Handout with details of how to apply medication and what to expect provided. Counseled to keep medication out of reach of children and pets.   Due to recent changes in healthcare laws, you may see results of your pathology and/or laboratory studies on MyChart before the doctors have had a chance to  review them. We understand that in some cases there may be results that are confusing or concerning to you. Please understand that not all results are received at the same time and often the doctors may need to interpret multiple results in order to provide you with the best plan of care or course of treatment. Therefore, we ask that you please give [place] 2 business days to thoroughly review all your results before contacting the office for clarification. Should we see a critical lab result, you will be contacted sooner.   If You Need Anything After Your Visit  If you have any questions or concerns for your doctor, please call our main line at [phone] and press option 4 to reach your doctor's medical assistant. If no one answers, please leave a voicemail as directed and we will return your call as soon as possible. Messages left after 4 pm will be answered the following business day.   You may also send [place] a message via MyChart. We typically respond to MyChart messages within 1-2 business days.  For prescription refills, please ask your pharmacy to contact our office. Our fax number is [phone].  If you have an urgent issue when the clinic is closed that cannot wait until the next business day, you can page your doctor at the number below.    Please note that while we do our best to be available for urgent issues outside of office hours, we are not available 24/7.   If you have an urgent issue and are unable to reach [place], you may choose to seek medical care at your doctor's office, retail clinic, urgent  care center, or emergency room.  If you have a medical emergency, please immediately call 911 or go to the emergency department.  Pager Numbers  - Dr. Gwen Pounds: [phone]  - Dr. Neale Burly: [phone]  - Dr. Roseanne Reno: [phone]  In the event of inclement weather, please call our main line at [phone] for an update on the status of any delays or closures.  Dermatology Medication  Tips: Please keep the boxes that topical medications come in in order to help keep track of the instructions about where and how to use these. Pharmacies typically print the medication instructions only on the boxes and not directly on the medication tubes.   If your medication is too expensive, please contact our office at [phone] option 4 or send [place] a message through MyChart.   We are unable to tell what your co-pay for medications will be in advance as this is different depending on your insurance coverage. However, we may be able to find a substitute medication at lower cost or fill out paperwork to get insurance to cover a needed medication.   If a prior authorization is required to get your medication covered by your insurance company, please allow [place] 1-2 business days to complete this process.  Drug prices often vary depending on where the prescription is filled and some pharmacies may offer cheaper prices.  The website www.goodrx.com contains coupons for medications through different pharmacies. The prices here do not account for what the cost may be with help from insurance (it may be cheaper with your insurance), but the website can give you the price if you did not use any insurance.  - You can print the associated coupon and take it with your prescription to the pharmacy.  - You may also stop by our office during regular business hours and pick up a GoodRx coupon card.  - If you need your prescription sent electronically to a different pharmacy, notify our office through [facility] or by phone at [phone] option 4.     Si Usted Necesita Algo Despus de Su Visita  Tambin puede enviarnos un mensaje a travs de Clinical cytogeneticist. Por lo general respondemos a los mensajes de MyChart en el transcurso de 1 a 2 das hbiles.  Para renovar recetas, por favor pida a su farmacia que se ponga en contacto con nuestra oficina. Annie Sable de fax es [place] [phone].  Si tiene un  asunto urgente cuando la clnica est cerrada y que no puede esperar hasta el siguiente da hbil, puede llamar/localizar a su doctor(a) al nmero que aparece a continuacin.   Por favor, tenga en cuenta que aunque hacemos todo lo posible para estar disponibles para asuntos urgentes fuera del horario de [place], no estamos disponibles las 24 horas del da, los 7 [address]  [address] de la [place].   Si tiene un problema urgente y no puede comunicarse con nosotros, puede optar por buscar atencin mdica  en el consultorio de su doctor(a), en una clnica privada, en un centro de atencin urgente o en una sala de emergencias.  Si tiene Engineer, drilling, por favor llame inmediatamente al 911 o vaya a la sala de emergencias.  Nmeros de bper  - Dr. Gwen Pounds: [phone]  - Dra. Moye: [phone]  - Dra. Roseanne Reno: [phone]  En caso de inclemencias del [place], por favor llame a Lacy Duverney principal al [phone] para una actualizacin sobre el [place] de cualquier retraso o cierre.  Consejos para la medicacin en dermatologa: Por favor, guarde las cajas en  las que vienen los medicamentos de uso tpico para ayudarle a seguir las instrucciones sobre dnde y cmo usarlos. Las farmacias generalmente imprimen las instrucciones del medicamento slo en las cajas y no directamente en los tubos del [place].   Si su medicamento es muy caro, por favor, pngase en contacto con Zigmund Daniel llamando al [phone] y presione la opcin 4 o envenos un mensaje a travs de Pharmacist, community.   No podemos decirle cul ser su copago por los medicamentos por adelantado ya que esto es diferente dependiendo de la cobertura de su seguro. Sin embargo, es posible que podamos encontrar un medicamento sustituto a Electrical engineer un formulario para que el seguro cubra el medicamento que se considera necesario.   Si se requiere una autorizacin previa para que su compaa de seguros [place] su medicamento, por favor  permtanos de 1 a 2 das hbiles para completar este proceso.  Los precios de los medicamentos varan con frecuencia dependiendo del Environmental consultant de dnde se surte la receta y alguna farmacias pueden ofrecer precios ms baratos.  El sitio web www.goodrx.com tiene cupones para medicamentos de Airline pilot. Los precios aqu no tienen en cuenta lo que podra costar con la ayuda del seguro (puede ser ms barato con su seguro), pero el sitio web puede darle el precio si no utiliz Research scientist (physical sciences).  - Puede imprimir el cupn correspondiente y llevarlo con su receta a la farmacia.  - Tambin puede pasar por nuestra oficina durante el horario de atencin regular y Charity fundraiser una tarjeta de cupones de GoodRx.  - Si necesita que su receta se enve electrnicamente a una farmacia diferente, informe a nuestra oficina a travs de MyChart de [place] o por telfono llamando al [phone] y presione la opcin 4.

## 2022-09-04 ENCOUNTER — Encounter: Payer: Self-pay | Admitting: Dermatology

## 2022-09-06 ENCOUNTER — Encounter: Payer: Self-pay | Admitting: Dermatology

## 2022-10-16 ENCOUNTER — Observation Stay
Admission: EM | Admit: 2022-10-16 | Discharge: 2022-10-17 | Disposition: A | Discharge status: Home Health | Payer: Medicare PPO | Attending: Student | Admitting: Student

## 2022-10-16 ENCOUNTER — Observation Stay: Payer: Medicare PPO

## 2022-10-16 ENCOUNTER — Other Ambulatory Visit: Payer: Self-pay

## 2022-10-16 ENCOUNTER — Emergency Department: Payer: Medicare PPO

## 2022-10-16 ENCOUNTER — Encounter: Payer: Self-pay | Admitting: Emergency Medicine

## 2022-10-16 DIAGNOSIS — R4182 Altered mental status, unspecified: Secondary | ICD-10-CM | POA: Diagnosis present

## 2022-10-16 DIAGNOSIS — I10 Essential (primary) hypertension: Secondary | ICD-10-CM | POA: Diagnosis not present

## 2022-10-16 DIAGNOSIS — Z79899 Other long term (current) drug therapy: Secondary | ICD-10-CM | POA: Insufficient documentation

## 2022-10-16 DIAGNOSIS — I482 Chronic atrial fibrillation, unspecified: Secondary | ICD-10-CM | POA: Diagnosis not present

## 2022-10-16 DIAGNOSIS — I639 Cerebral infarction, unspecified: Principal | ICD-10-CM | POA: Insufficient documentation

## 2022-10-16 DIAGNOSIS — R202 Paresthesia of skin: Secondary | ICD-10-CM

## 2022-10-16 DIAGNOSIS — G250 Essential tremor: Secondary | ICD-10-CM | POA: Insufficient documentation

## 2022-10-16 DIAGNOSIS — M6281 Muscle weakness (generalized): Secondary | ICD-10-CM | POA: Insufficient documentation

## 2022-10-16 LAB — COMPREHENSIVE METABOLIC PANEL
ALT: 13 U/L (ref 0–44)
AST: 18 U/L (ref 15–41)
Albumin: 4 g/dL (ref 3.5–5.0)
Alkaline Phosphatase: 82 U/L (ref 38–126)
Anion gap: 8 (ref 5–15)
BUN: 21 mg/dL (ref 8–23)
CO2: 27 mmol/L (ref 22–32)
Calcium: 9.7 mg/dL (ref 8.9–10.3)
Chloride: 104 mmol/L (ref 98–111)
Creatinine, Ser: 0.79 mg/dL (ref 0.44–1.00)
GFR, Estimated: >60 mL/min (ref >60)
Glucose, Bld: 125 mg/dL — ABNORMAL HIGH (ref 70–99)
Potassium: 4.1 mmol/L (ref 3.5–5.1)
Sodium: 139 mmol/L (ref 135–145)
Total Bilirubin: 1.3 mg/dL — ABNORMAL HIGH (ref 0.3–1.2)
Total Protein: 7.7 g/dL (ref 6.5–8.1)

## 2022-10-16 LAB — CBC
HCT: 37.4 % (ref 36.0–46.0)
Hemoglobin: 12.3 g/dL (ref 12.0–15.0)
MCH: 32.7 pg (ref 26.0–34.0)
MCHC: 32.9 g/dL (ref 30.0–36.0)
MCV: 99.5 fL (ref 80.0–100.0)
Platelets: 311 10*3/uL (ref 150–400)
RBC: 3.76 MIL/uL — ABNORMAL LOW (ref 3.87–5.11)
RDW: 12.6 % (ref 11.5–15.5)
WBC: 6.4 10*3/uL (ref 4.0–10.5)
nRBC: 0 % (ref 0.0–0.2)

## 2022-10-16 LAB — DIFFERENTIAL
Abs Immature Granulocytes: 0.03 10*3/uL (ref 0.00–0.07)
Basophils Absolute: 0.1 10*3/uL (ref 0.0–0.1)
Basophils Relative: 1 %
Eosinophils Absolute: 0.3 10*3/uL (ref 0.0–0.5)
Eosinophils Relative: 5 %
Immature Granulocytes: 1 %
Lymphocytes Relative: 10 %
Lymphs Abs: 0.7 10*3/uL (ref 0.7–4.0)
Monocytes Absolute: 0.6 10*3/uL (ref 0.1–1.0)
Monocytes Relative: 9 %
Neutro Abs: 4.8 10*3/uL (ref 1.7–7.7)
Neutrophils Relative %: 74 %

## 2022-10-16 LAB — APTT: aPTT: 37 seconds — ABNORMAL HIGH (ref 24–36)

## 2022-10-16 LAB — PROTIME-INR
INR: 1.1 (ref 0.8–1.2)
Prothrombin Time: 13.8 seconds (ref 11.4–15.2)

## 2022-10-16 LAB — ETHANOL: Alcohol, Ethyl (B): <10 mg/dL (ref <10)

## 2022-10-16 LAB — CBG MONITORING, ED: Glucose-Capillary: 117 mg/dL — ABNORMAL HIGH (ref 70–99)

## 2022-10-16 MED ORDER — ASPIRIN 81 MG PO TBEC
81.0000 mg | DELAYED_RELEASE_TABLET | Freq: Every day | ORAL | Status: DC
  Administered 2022-10-17: 81 mg via ORAL
  Filled 2022-10-16: qty 1

## 2022-10-16 MED ORDER — ACETAMINOPHEN 650 MG RE SUPP: 650.0000 mg | RECTAL | Status: DC | PRN

## 2022-10-16 MED ORDER — ASPIRIN 325 MG PO TABS
325.0000 mg | ORAL_TABLET | Freq: Once | ORAL | Status: AC
  Administered 2022-10-16: 325 mg via ORAL
  Filled 2022-10-16: qty 1

## 2022-10-16 MED ORDER — IOHEXOL 350 MG/ML SOLN
75.0000 mL | Freq: Once | INTRAVENOUS | Status: AC | PRN
  Administered 2022-10-16: 75 mL via INTRAVENOUS

## 2022-10-16 MED ORDER — ATORVASTATIN CALCIUM 20 MG PO TABS
20.0000 mg | ORAL_TABLET | Freq: Every day | ORAL | Status: DC
  Administered 2022-10-16: 20 mg via ORAL
  Filled 2022-10-16: qty 1

## 2022-10-16 MED ORDER — VITAMIN B-12 1000 MCG PO TABS
1000.0000 ug | ORAL_TABLET | Freq: Every day | ORAL | Status: DC
  Administered 2022-10-17: 1000 ug via ORAL
  Filled 2022-10-16: qty 1

## 2022-10-16 MED ORDER — CLOPIDOGREL BISULFATE 75 MG PO TABS
75.0000 mg | ORAL_TABLET | Freq: Every day | ORAL | Status: DC
  Administered 2022-10-16: 75 mg via ORAL
  Filled 2022-10-16: qty 1

## 2022-10-16 MED ORDER — SENNOSIDES-DOCUSATE SODIUM 8.6-50 MG PO TABS: 1.0000 | ORAL_TABLET | Freq: Every evening | ORAL | Status: DC | PRN

## 2022-10-16 MED ORDER — STROKE: EARLY STAGES OF RECOVERY BOOK: Freq: Once | Status: AC

## 2022-10-16 MED ORDER — LORATADINE 10 MG PO TABS
10.0000 mg | ORAL_TABLET | Freq: Every day | ORAL | Status: DC
  Administered 2022-10-17: 10 mg via ORAL
  Filled 2022-10-16: qty 1

## 2022-10-16 MED ORDER — TRAMADOL HCL 50 MG PO TABS
50.0000 mg | ORAL_TABLET | Freq: Two times a day (BID) | ORAL | Status: DC | PRN
  Administered 2022-10-17: 50 mg via ORAL
  Filled 2022-10-16: qty 1

## 2022-10-16 MED ORDER — ACETAMINOPHEN 325 MG PO TABS: 650.0000 mg | ORAL_TABLET | ORAL | Status: DC | PRN

## 2022-10-16 MED ORDER — ACETAMINOPHEN 160 MG/5ML PO SOLN: 650.0000 mg | ORAL | Status: DC | PRN

## 2022-10-16 MED ORDER — PANTOPRAZOLE SODIUM 40 MG PO TBEC
40.0000 mg | DELAYED_RELEASE_TABLET | Freq: Every day | ORAL | Status: DC
  Administered 2022-10-17: 40 mg via ORAL
  Filled 2022-10-16: qty 1

## 2022-10-16 MED ORDER — SODIUM CHLORIDE 0.9% FLUSH
3.0000 mL | Freq: Once | INTRAVENOUS | Status: AC
  Administered 2022-10-16: 3 mL via INTRAVENOUS

## 2022-10-16 MED ORDER — FERROUS SULFATE 325 (65 FE) MG PO TABS
325.0000 mg | ORAL_TABLET | Freq: Every day | ORAL | Status: DC
  Administered 2022-10-17: 325 mg via ORAL
  Filled 2022-10-16: qty 1

## 2022-10-16 NOTE — Assessment & Plan Note (Signed)
Not on anticoagulation due to history of GI bleed

## 2022-10-16 NOTE — ED Notes (Signed)
Patient evaluated by triage PA at this time. Code stroke called by PA

## 2022-10-16 NOTE — ED Notes (Signed)
Pt transferred to hospital bed for comfort per request.

## 2022-10-16 NOTE — Consult Note (Addendum)
NEUROLOGY CONSULTATION NOTE   Date of service: October 16, 2022 Patient Name: Lauren Wheeler MRN:  [id number] DOB:  May 10, 1927 Reason for consult: stroke code Requesting physician: Dr. Larinda Buttery _ _ _   _ __   _ __ _ _  __ __   _ __   __ _  History of Present Illness   This is a 88 yo woman with pmhx HTN, HL, essential tremor, hx a fib not on anticoagulation 2/2 prior GI bleeds who presents after episode of L facial pain, visual disturbance, and confusion, improving but not resolved. LKW 0830. NIHSS = 3 for questions, poorly reproducible possible L visual field deficit (resolved over course of stroke code), minimal L sided sensory deficit. CT head showed possible hyperdense L PCA and PCA occlusion was confirmed on CTA. CNS imaging personally reviewed and discussed with radiology. TNK was not administered 2/2 presentation outside the window. Intervention was not performed 2/2 very mild symptoms that did not localize to the L PCA territory and absence of symptoms that would localize there (such as R visual field deficit).   ROS   Per HPI: all other systems reviewed and are negative  Past History   I have reviewed the following:  Past Medical History:  Diagnosis Date   Essential tremor    HLD (hyperlipidemia)    HTN (hypertension)    Past Surgical History:  Procedure Laterality Date   APPENDECTOMY     COLONOSCOPY     dental procedure     ESOPHAGOGASTRODUODENOSCOPY N/A 12/20/2020   Procedure: ESOPHAGOGASTRODUODENOSCOPY (EGD);  Surgeon: Regis Bill, MD;  Location: [facility] ENDOSCOPY;  Service: Endoscopy;  Laterality: N/A;   TONSILLECTOMY     TUBAL LIGATION     Family History  Problem Relation Age of Onset   Parkinson's disease Mother    Stroke Father    Social History   Socioeconomic History   Marital status: Married    Spouse name: Not on file   Number of children: Not on file   Years of education: Not on file   Highest education level: Not on file  Occupational History    Not on file  Tobacco Use   Smoking status: Never   Smokeless tobacco: Never  Substance and Sexual Activity   Alcohol use: Never   Drug use: Never   Sexual activity: Not on file  Other Topics Concern   Not on file  Social History Narrative   Not on file   Social Determinants of Health   Financial Resource Strain: Not on file  Food Insecurity: Not on file  Transportation Needs: Not on file  Physical Activity: Not on file  Stress: Not on file  Social Connections: Not on file   No Known Allergies  Medications   (Not in a hospital admission)     Current Facility-Administered Medications:    aspirin tablet 325 mg, 325 mg, Oral, Once **FOLLOWED BY** [START ON 10/17/2022] aspirin EC tablet 81 mg, 81 mg, Oral, Daily, Jefferson Fuel, MD   clopidogrel (PLAVIX) tablet 75 mg, 75 mg, Oral, Daily, Jefferson Fuel, MD  Current Outpatient Medications:    albuterol (VENTOLIN HFA) 108 (90 Base) MCG/ACT inhaler, Inhale 2 puffs into the lungs every 6 (six) hours as needed for wheezing or shortness of breath., Disp: 8 g, Rfl: 0   atorvastatin (LIPITOR) 10 MG tablet, Take 10 mg by mouth at bedtime., Disp: , Rfl:    cetirizine (ZYRTEC) 5 MG tablet, Take 5 mg by mouth daily  as needed for allergies., Disp: , Rfl:    clobetasol (TEMOVATE) 0.05 % external solution, Apply twice daily to affected body areas as directed. Mixed with CeraVe cream, Disp: 50 mL, Rfl: 2   clobetasol (TEMOVATE) 0.05 % external solution, Spot treat areas at scalp as needed for itch once daily. Avoid applying to face, groin, and axilla. Use as directed. Long-term use can cause thinning of the skin., Disp: 50 mL, Rfl: 1   diltiazem (CARDIZEM CD) 120 MG 24 hr capsule, Take 1 capsule (120 mg total) by mouth daily., Disp: 30 capsule, Rfl: 0   fluorouracil (EFUDEX) 5 % cream, Apply topically 2 (two) times daily. For 7 days to nose as directed, Disp: 40 g, Rfl: 0   Iron-Vitamin C 65-125 MG TABS, Take 1 tablet by mouth daily.,  Disp: , Rfl:    ketoconazole (NIZORAL) 2 % shampoo, 2-3 times per week lather on scalp and ears, rinse well., Disp: 120 mL, Rfl: 3   pantoprazole (PROTONIX) 40 MG tablet, Take 1 tablet (40 mg total) by mouth 2 (two) times daily before a meal. (Patient taking differently: Take 40 mg by mouth daily.), Disp: 60 tablet, Rfl: 3   propranolol (INDERAL) 40 MG tablet, Take 40 mg by mouth 2 (two) times daily., Disp: , Rfl:    traMADol (ULTRAM) 50 MG tablet, Take 1 tablet (50 mg total) by mouth every 12 (twelve) hours as needed for severe pain. (Patient taking differently: Take 50 mg by mouth daily as needed for severe pain or moderate pain.), Disp: 15 tablet, Rfl: 0   vitamin B-12 1000 MCG tablet, Take 1 tablet (1,000 mcg total) by mouth daily., Disp: 30 tablet, Rfl: 1  Vitals   Vitals:   10/16/22 1359 10/16/22 1444  BP: (!) 167/80 (!) 131/98  Pulse: (!) 57 74  Resp: 18 20  Temp: 97.9 F (36.6 C) 97.9 F (36.6 C)  TempSrc: Oral Oral  SpO2: 98% 94%     There is no height or weight on file to calculate BMI.  Physical Exam   Physical Exam Gen: alert, oriented to self, hospital, and time but not age, able to follow simple commands HEENT: Atraumatic, normocephalic;mucous membranes moist; oropharynx clear, tongue without atrophy or fasciculations. Neck: Supple, trachea midline. Resp: CTAB, no w/r/r CV: RRR, no m/g/r; nml S1 and S2. 2+ symmetric peripheral pulses. Abd: soft/NT/ND; nabs x 4 quad Extrem: Nml bulk; no cyanosis, clubbing, or edema.  Neuro: *MS: alert, oriented to self, hospital, and time but not age, able to follow simple commands *Speech: fluid, nondysarthric, able to name and repeat *CN:    I: Deferred   II,III: PERRLA, initially thought to have poorly reproducible L homonymous hemianopsia but visual fields were intact on repeat exam, optic discs unable to be visualized 2/2 pupillary constriction   III,IV,VI: EOMI w/o nystagmus, no ptosis   V: Minimal sensory deficit to LT on  L   VII: Eyelid closure was full.  Smile symmetric.   VIII: Hearing intact to voice   IX,X: Voice normal, palate elevates symmetrically    XI: SCM/trap 5/5 bilat   XII: Tongue protrudes midline, no atrophy or fasciculations   *Motor:   Normal bulk.  No tremor, rigidity or bradykinesia. No pronator drift.    Strength: Dlt Bic Tri WrE WrF FgS Gr HF KnF KnE PlF DoF    Left 5 5 5 5 5 5 5 5 5 5 5 5     Right 5 5 5 5 5 5 5  5  5 5 5 5     *Sensory: Intact to light touch, pinprick, temperature vibration throughout. Symmetric. Propioception intact bilat.  No double-simultaneous extinction.  *Coordination:  Finger-to-nose, heel-to-shin, rapid alternating motions were intact. *Reflexes:  2+ and symmetric throughout without clonus; toes down-going bilat *Gait: deferred  NIHSS = 3 for questions, visual fields, sensory  Premorbid mRS = 2   Labs   CBC:  Recent Labs  Lab 10/16/22 1355  WBC 6.4  NEUTROABS 4.8  HGB 12.3  HCT 37.4  MCV 99.5  PLT 311    Basic Metabolic Panel:  Lab Results  Component Value Date   NA 139 10/16/2022   K 4.1 10/16/2022   CO2 27 10/16/2022   GLUCOSE 125 (H) 10/16/2022   BUN 21 10/16/2022   CREATININE 0.79 10/16/2022   CALCIUM 9.7 10/16/2022   GFRNONAA >60 10/16/2022   Lipid Panel: No results found for: "LDLCALC" HgbA1c: No results found for: "HGBA1C" Urine Drug Screen: No results found for: "LABOPIA", "COCAINSCRNUR", "LABBENZ", "AMPHETMU", "THCU", "LABBARB"  Alcohol Level     Component Value Date/Time   ETH <10 10/16/2022 1355     Impression   This is a 88 yo woman with pmhx HTN, HL, essential tremor, hx a fib not on anticoagulation 2/2 prior GI bleeds who presents after episode of L facial pain, visual disturbance, and confusion, improving but not resolved.   NIHSS = 3 for questions, poorly reproducible possible L visual field deficit (resolved over course of stroke code), minimal L sided sensory deficit. Suspect possible small acute infarct vs  TIA warranting admission for workup. TNK was not administered 2/2 presentation outside the window.   CT head showed possible hyperdense L PCA and PCA occlusion was confirmed on CTA. Intervention was not performed 2/2 very mild symptoms that did not localize to the L PCA territory and absence of symptoms that would localize there (such as R visual field deficit).  Recommendations   - Admit for stroke workup - Permissive HTN x48 hrs from sx onset or until stroke ruled out by MRI goal BP <220/110. PRN labetalol or hydralazine if BP above these parameters. Avoid oral antihypertensives. - MRI brain wo contrast - TTE  - Check A1c and LDL + add statin per guidelines - ASA 81mg  daily + plavix 75mg  daily x21 days f/b ASA 81mg  daily monotherapy after that (90 days 2/2 PCA occlusion) - q4 hr neuro checks - STAT head CT for any change in neuro exam - Tele - PT/OT/SLP - Stroke education - Amb referral to neurology upon discharge - Will continue to follow  ______________________________________________________________________   Thank you for the opportunity to take part in the care of this patient. If you have any further questions, please contact the neurology consultation attending.  Signed,  83, MD [facility] [phone]  If 7pm- 7am, please page neurology on call as listed in AMION.  **Any copied and pasted documentation in this note was written by me in another application not billed for and pasted by me into this document.

## 2022-10-16 NOTE — ED Notes (Signed)
Code  stroke  called  to  carelink 

## 2022-10-16 NOTE — Assessment & Plan Note (Addendum)
Patient with a history of atrial fibrillation not on anticoagulation due to history of GI bleed who presents for evaluation of strokelike symptoms. Code stroke was called upon patient's arrival to the ER and she was seen emergently by neurologist Initial CT scan of the head showed asymmetric hyperdensity of the left PCA P2 segment, likely reflecting endoluminal thrombus or embolus given the provided history of right eye vision loss.  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 Correlate for signs/symptoms of acute left PCA territory infarct. Obtain MRI of the brain without contrast to rule out an acute infarct Obtain 2D echocardiogram to assess LVEF and rule out cardiac thrombus Request PT/OT/ST consult Start patient on dual antiplatelet therapy and high intensity statin Allow for permissive hypertension Neurology consult

## 2022-10-16 NOTE — Assessment & Plan Note (Signed)
Allow for permissive hypertension 

## 2022-10-16 NOTE — ED Notes (Signed)
Pt ambulated to toilet with walker. Staff walked behind pt and assisted when needed.

## 2022-10-16 NOTE — Assessment & Plan Note (Signed)
Chronic Stable 

## 2022-10-16 NOTE — ED Provider Triage Note (Signed)
Emergency Medicine Provider Triage Evaluation Note  Lauren Wheeler, a 88 y.o. female  was evaluated in triage.  Pt complains of headache and confusion.  Presents to the ED companied by her adult daughters, with patient being aware of her last known well time of about 11:30 AM.  She again to experience a headache and some confusion.  She was able to text her daughter's to notify them that she felt unwell but could not completely verbalize what she was experiencing.  She denies any gross weakness, slurred speech, head injury, or syncope.  Review of Systems  Positive: Confusion, vision change, and headache Negative: LOC  Physical Exam  BP (!) 167/80   Pulse (!) 57   Temp 97.9 F (36.6 C) (Oral)   Resp 18   SpO2 98%  Gen:   Awake, no distress  NAD Resp:  Normal effort CTA MSK:   Moves extremities without difficulty  Other:    Medical Decision Making  Medically screening exam initiated at 2:00 PM.  Appropriate orders placed.  Lauren Wheeler was informed that the remainder of the evaluation will be completed by another provider, this initial triage assessment does not replace that evaluation, and the importance of remaining in the ED until their evaluation is complete.  Geriatric patient to the ED for evaluation of acute headache, generalized confusion, and visual disturbance.  Stroke was debated as the patient's last known well was approximately 12 noon.   Lissa Hoard, PA-C 10/16/22 1402

## 2022-10-16 NOTE — ED Triage Notes (Signed)
Patient to ED via POV with family for stroke like symptoms. Patient LKW 1100. Per family patient more confused, right sided visual difficulties, headache, and slurred speech.

## 2022-10-16 NOTE — Progress Notes (Signed)
1401- Code stroke activation via cart, pt is in CT 1410- Dr Selina Cooley was paged 1416- Dr Selina Cooley arrived to CT 1440- Assesment complete- no TNK d/t LKW >4.5hrs. NIH 3 MRS 1

## 2022-10-16 NOTE — ED Notes (Signed)
Patient to CT at this time with Dahlia Client, RN and Beecher Mcardle, RN.

## 2022-10-16 NOTE — Code Documentation (Signed)
Stroke Response Nurse Documentation Code Documentation  Lauren Wheeler is a 88 y.o. female arriving to [facility] via [employer] on 10/16/2022 with past medical hx of HLD,HTN, essential tremor. On No antithrombotic. Code stroke was activated by ED.   Patient from home where she was LKW at [place]. Patient last seen normal by family at 67. Patient reports she was sitting in her chair at home when she started to feel as though "things were just not right" and had left sided eye pain. Pt told family something wasn't right and was brought to the ED for evaluation.   Stroke team and EDP Dr. Charna Archer met patient in CT after Code Stroke activation.  Patient to CT with team. NIHSS 3, see documentation for details and code stroke times. Patient with disoriented on exam. The following imaging was completed:  CT Head and CTA. Patient is not a candidate for IV Thrombolytic due to outside window per MD. Patient is not a candidate for IR due to no LVO seen on imaging.   Care Plan: Q2h NIHSS + VS .   Bedside handoff with ED RN Vet and Tammy.    Charise Carwin  Stroke Response RN

## 2022-10-16 NOTE — ED Provider Notes (Signed)
[facility] Provider Note    Event Date/Time   First MD Initiated Contact with Patient 10/16/22 1412     (approximate)   History   Chief Complaint Code Stroke   HPI  Lauren Wheeler is a 88 y.o. female with past medical history of hypertension, hyperlipidemia, atrial fibrillation, and interstitial lung disease who presents to the ED for altered mental status.  Per daughters at bedside, patient was last seen well at 830 this morning, when one of the daughters had visited her.  Another daughter then got a nonsensical text message from the patient around noon, after which they went to check on her.  They found the patient confused with slurred speech and complaining of vision changes in her right eye.  They brought the patient to the ED, where code stroke was activated.     Physical Exam   Triage Vital Signs: ED Triage Vitals  Enc Vitals Group     BP 10/16/22 1359 (!) 167/80     Pulse Rate 10/16/22 1359 (!) 57     Resp 10/16/22 1359 18     Temp 10/16/22 1359 97.9 F (36.6 C)     Temp Source 10/16/22 1359 Oral     SpO2 10/16/22 1359 98 %     Weight --      Height --      Head Circumference --      Peak Flow --      Pain Score 10/16/22 1401 0     Pain Loc --      Pain Edu? --      Excl. in GC? --     Most recent vital signs: Vitals:   10/16/22 1359 10/16/22 1444  BP: (!) 167/80 (!) 131/98  Pulse: (!) 57 74  Resp: 18 20  Temp: 97.9 F (36.6 C) 97.9 F (36.6 C)  SpO2: 98% 94%    Constitutional: Alert and oriented. Eyes: Conjunctivae are normal.  Pupils equal, round, and reactive to light bilaterally. Head: Atraumatic. Nose: No congestion/rhinnorhea. Mouth/Throat: Mucous membranes are moist.  Cardiovascular: Normal rate, regular rhythm. Grossly normal heart sounds.  2+ radial pulses bilaterally. Respiratory: Normal respiratory effort.  No retractions. Lungs CTAB. Gastrointestinal: Soft and nontender. No distention. Musculoskeletal: No  lower extremity tenderness nor edema.  Neurologic:  Normal speech and language. No gross focal neurologic deficits are appreciated.    ED Results / Procedures / Treatments   Labs (all labs ordered are listed, but only abnormal results are displayed) Labs Reviewed  APTT - Abnormal; Notable for the following components:      Result Value   aPTT 37 (*)    All other components within normal limits  CBC - Abnormal; Notable for the following components:   RBC 3.76 (*)    All other components within normal limits  COMPREHENSIVE METABOLIC PANEL - Abnormal; Notable for the following components:   Glucose, Bld 125 (*)    Total Bilirubin 1.3 (*)    All other components within normal limits  CBG MONITORING, ED - Abnormal; Notable for the following components:   Glucose-Capillary 117 (*)    All other components within normal limits  PROTIME-INR  DIFFERENTIAL  ETHANOL  I-STAT CREATININE, ED  CBG MONITORING, ED     EKG  ED ECG REPORT I, Chesley Noon, the attending physician, personally viewed and interpreted this ECG.   Date: 10/16/2022  EKG Time: 14:40  Rate: 60  Rhythm: normal sinus rhythm  Axis: Normal  Intervals:none  ST&T Change: None  RADIOLOGY CT head reviewed and interpreted by me with no hemorrhage or midline shift.  PROCEDURES:  Critical Care performed: Yes, see critical care procedure note(s)  .Critical Care  Performed by: Chesley Noon, MD Authorized by: Chesley Noon, MD   Critical care provider statement:    Critical care time (minutes):  30   Critical care time was exclusive of:  Separately billable procedures and treating other patients and teaching time   Critical care was necessary to treat or prevent imminent or life-threatening deterioration of the following conditions:  CNS failure or compromise   Critical care was time spent personally by me on the following activities:  Development of treatment plan with patient or surrogate, discussions with  consultants, evaluation of patient's response to treatment, examination of patient, ordering and review of laboratory studies, ordering and review of radiographic studies, ordering and performing treatments and interventions, pulse oximetry, re-evaluation of patient's condition and review of old charts   I assumed direction of critical care for this patient from another provider in my specialty: no     Care discussed with: admitting provider      MEDICATIONS ORDERED IN ED: Medications  aspirin tablet 325 mg (has no administration in time range)    Followed by  aspirin EC tablet 81 mg (has no administration in time range)  clopidogrel (PLAVIX) tablet 75 mg (has no administration in time range)  sodium chloride flush (NS) 0.9 % injection 3 mL (3 mLs Intravenous Given 10/16/22 1450)  iohexol (OMNIPAQUE) 350 MG/ML injection 75 mL (75 mLs Intravenous Contrast Given 10/16/22 1424)     IMPRESSION / MDM / ASSESSMENT AND PLAN / ED COURSE  I reviewed the triage vital signs and the nursing notes.                              88 y.o. female with past medical history of hypertension, hyperlipidemia, atrial fibrillation, and interstitial lung disease who presents to the ED complaining of acute onset confusion, speech changes, and vision changes.  Patient's presentation is most consistent with acute presentation with potential threat to life or bodily function.  Differential diagnosis includes, but is not limited to, stroke, TIA, electrolyte abnormality, anemia, UTI, arrhythmia, ACS.  Patient nontoxic-appearing and in no acute distress, vital signs are unremarkable.  FSBG is unremarkable, EKG shows no evidence of arrhythmia or ischemia.  Code stroke activated on arrival and CT head shows possible acute PCA territory infarct.  Patient is outside the window for TNK given last known well time of 830, CTA performed and shows left PCA occlusion, but per neurology this does not correlate with patient's exam  and they recommend against pursuing further intervention.  Patient would benefit from admission and further stroke workup.  Labs are unremarkable with no significant anemia, leukocytosis, electrolyte abnormality, or AKI.  Coags are also unremarkable.  Case discussed with hospitalist for admission.      FINAL CLINICAL IMPRESSION(S) / ED DIAGNOSES   Final diagnoses:  Cerebrovascular accident (CVA), unspecified mechanism (HCC)     Rx / DC Orders   ED Discharge Orders     None        Note:  This document was prepared using Dragon voice recognition software and may include unintentional dictation errors.   Chesley Noon, MD 10/16/22 [phone]

## 2022-10-16 NOTE — H&P (Signed)
History and Physical    Patient: Lauren Wheeler DOB: 01-05-1927 DOA: 10/16/2022 DOS: the patient was seen and examined on 10/16/2022 PCP: [facility]  Patient coming from: Home  Chief Complaint:  Chief Complaint  Patient presents with   Code Stroke   HPI: Lauren Wheeler is a 88 y.o. female with medical history significant for essential tremor, dyslipidemia and hypertension who arrives the emergency room via private vehicle for evaluation of strokelike symptoms. Patient's last known well was about 8:30 AM when her daughter saw her this morning.  Patient notes that she developed symptoms which she described as a headache, right-sided visual difficulties and not being able to do the things she would usually do like putting the TV on.  And patient was she was unable to use the remote control but denies any weakness of her upper or lower extremities.  She texted her daughter who came to the house promptly and brought her to the emergency room for evaluation. A code stroke was called upon her arrival and she was seen emergently by neurology.  She was not a candidate for thrombolytics as she was outside the window. Patient feels better and states that her headache has resolved.  Her vision has also improved and she is able to move all extremities. She denies having any chest pain, no shortness of breath, no nausea, no vomiting, no dizziness, no lightheadedness, no abdominal pain, no changes in her bowel habits, no urinary symptoms, no blurred vision no focal deficit. CT scan of the head without contrast shows asymmetric hyperdensity of the left PCA P2 segment, likely reflecting endoluminal thrombus or embolus given the provided history of right eye vision loss. A CTA is recommended for further evaluation. No evidence of acute intracranial hemorrhage or acute infarct. Mild generalized cerebral atrophy. CT angiogram of the head and neck shows the common carotid and internal carotid  arteries are patent within the neck without hemodynamically significant stenosis. Atherosclerotic plaque bilaterally, as described. Beaded irregularity of the mid-to-distal cervical left ICA, likely reflecting sequela of fibromuscular dysplasia. The vertebral arteries codominant and patent within the neck without stenosis. Aortic Atherosclerosis. 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 Correlate for signs/symptoms of acute left PCA territory infarct. Twelve-lead EKG reviewed by me shows sinus rhythm with PACs Patient will be referred to observation status for further evaluation.   Review of Systems: As mentioned in the history of present illness. All other systems reviewed and are negative. Past Medical History:  Diagnosis Date   Essential tremor    HLD (hyperlipidemia)    HTN (hypertension)    Past Surgical History:  Procedure Laterality Date   APPENDECTOMY     COLONOSCOPY     dental procedure     ESOPHAGOGASTRODUODENOSCOPY N/A 12/20/2020   Procedure: ESOPHAGOGASTRODUODENOSCOPY (EGD);  Surgeon: Regis Bill, MD;  Location: [facility] ENDOSCOPY;  Service: Endoscopy;  Laterality: N/A;   TONSILLECTOMY     TUBAL LIGATION     Social History:  reports that she has never smoked. She has never used smokeless tobacco. She reports that she does not drink alcohol and does not use drugs.  Allergies  Allergen Reactions   Hydrochlorothiazide Other (See Comments)    Hyponatremia   Diltiazem Hcl Rash    Family History  Problem Relation Age of Onset   Parkinson's disease Mother    Stroke Father     Prior to Admission medications   Medication Sig Start Date End Date  Taking? Authorizing Provider  atorvastatin (LIPITOR) 10 MG tablet Take 10 mg by mouth at bedtime.   Yes [provider]  clobetasol (TEMOVATE) 0.05 % external solution Spot treat areas at scalp as needed for itch once daily. Avoid applying to face, groin, and axilla. Use as  directed. Long-term use can cause thinning of the skin. 09/03/22  Yes Moye, [place], MD  Iron-Vitamin C 65-125 MG TABS Take 1 tablet by mouth daily.   Yes [provider]  ketoconazole (NIZORAL) 2 % shampoo 2-3 times per week lather on scalp and ears, rinse well. 07/17/22  Yes Moye, [place], MD  pantoprazole (PROTONIX) 40 MG tablet Take 1 tablet (40 mg total) by mouth 2 (two) times daily before a meal. Patient taking differently: Take 40 mg by mouth daily. 12/24/20  Yes Enedina Finner, MD  propranolol (INDERAL) 40 MG tablet Take 40 mg by mouth 2 (two) times daily.   Yes [provider]  vitamin B-12 1000 MCG tablet Take 1 tablet (1,000 mcg total) by mouth daily. 12/24/20  Yes Enedina Finner, MD  albuterol (VENTOLIN HFA) 108 (90 Base) MCG/ACT inhaler Inhale 2 puffs into the lungs every 6 (six) hours as needed for wheezing or shortness of breath. Patient not taking: Reported on 10/16/2022 11/30/21   Alford Highland, MD  cetirizine (ZYRTEC) 5 MG tablet Take 5 mg by mouth daily as needed for allergies.    [provider]  fluorouracil (EFUDEX) 5 % cream Apply topically 2 (two) times daily. For 7 days to nose as directed Patient not taking: Reported on 10/16/2022 09/03/22   Neale Burly, [place], MD  traMADol (ULTRAM) 50 MG tablet Take 1 tablet (50 mg total) by mouth every 12 (twelve) hours as needed for severe pain. Patient taking differently: Take 50 mg by mouth daily as needed for severe pain or moderate pain. 12/23/20   Enedina Finner, MD    Physical Exam: Vitals:   10/16/22 1359 10/16/22 1444  BP: (!) 167/80 (!) 131/98  Pulse: (!) 57 74  Resp: 18 20  Temp: 97.9 F (36.6 C) 97.9 F (36.6 C)  TempSrc: Oral Oral  SpO2: 98% 94%   Physical Exam Vitals and nursing note reviewed.  Constitutional:      Appearance: Normal appearance.  HENT:     Head: Normocephalic and atraumatic.     Nose: Nose normal.     Mouth/Throat:     Mouth: Mucous membranes are moist.  Eyes:      Conjunctiva/sclera: Conjunctivae normal.  Cardiovascular:     Rate and Rhythm: Normal rate and regular rhythm.  Pulmonary:     Effort: Pulmonary effort is normal.     Breath sounds: Normal breath sounds.  Abdominal:     General: Abdomen is flat. Bowel sounds are normal.     Palpations: Abdomen is soft.  Musculoskeletal:        General: Normal range of motion.     Cervical back: Normal range of motion and neck supple.  Skin:    General: Skin is warm and dry.  Neurological:     Mental Status: She is alert and oriented to person, place, and time.     Comments: Able to move all extremities  Psychiatric:        Mood and Affect: Mood normal.        Behavior: Behavior normal.     Data Reviewed: Relevant notes from primary care and specialist visits, past discharge summaries as available in EHR, including Care Everywhere. Prior diagnostic testing  as pertinent to current admission diagnoses Updated medications and problem lists for reconciliation ED course, including vitals, labs, imaging, treatment and response to treatment Triage notes, nursing and pharmacy notes and ED provider's notes Notable results as noted in HPI Labs reviewed.  PT 13.8, INR 1.1, sodium 139, potassium 4.1, chloride 104, bicarb 27, glucose 125, BUN 21, creatinine 0.79, calcium 9.7, total protein 7.7, albumin 4.0, AST 18, ALT 13, alkaline phosphatase 82, total bilirubin 1.3, white count 6.4, hemoglobin 12.3, hematocrit 37.4, platelet count 311 There are no new results to review at this time.  Assessment and Plan: * Acute CVA (cerebrovascular accident) [facility]) Patient with a history of atrial fibrillation not on anticoagulation due to history of GI bleed who presents for evaluation of strokelike symptoms. Code stroke was called upon patient's arrival to the ER and she was seen emergently by neurologist Initial CT scan of the head showed asymmetric hyperdensity of the left PCA P2 segment, likely reflecting endoluminal  thrombus or embolus given the provided history of right eye vision loss.  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 Correlate for signs/symptoms of acute left PCA territory infarct. Obtain MRI of the brain without contrast to rule out an acute infarct Obtain 2D echocardiogram to assess LVEF and rule out cardiac thrombus Request PT/OT/ST consult Start patient on dual antiplatelet therapy and high intensity statin Allow for permissive hypertension Neurology consult   Atrial fibrillation, chronic (HCC) Not on anticoagulation due to history of GI bleed  Essential tremor Chronic.  Stable  Essential hypertension Allow for permissive hypertension      Advance Care Planning:   Code Status: DNR   Consults: Neurology  Family Communication: Greater than 50% of time was spent discussing patient's condition and plan of care with her and her daughters at the bedside.  All questions and concerns have been addressed.  They verbalized understanding and agree with the plan.  CODE STATUS was discussed and she wishes to be a DNR.  She lists her daughter Ms. Raquel James as her healthcare power of attorney.  Severity of Illness: The appropriate patient status for this patient is OBSERVATION. Observation status is judged to be reasonable and necessary in order to provide the required intensity of service to ensure the patient's safety. The patient's presenting symptoms, physical exam findings, and initial radiographic and laboratory data in the context of their medical condition is felt to place them at decreased risk for further clinical deterioration. Furthermore, it is anticipated that the patient will be medically stable for discharge from the hospital within 2 midnights of admission.   Author: Lucile Shutters, MD 10/16/2022 4:04 PM  For on call review [email or web address].

## 2022-10-17 ENCOUNTER — Observation Stay
Admit: 2022-10-17 | Discharge: 2022-10-17 | Disposition: A | Discharge status: Home / Self Care | Payer: Medicare PPO | Attending: Internal Medicine | Admitting: Internal Medicine

## 2022-10-17 DIAGNOSIS — I482 Chronic atrial fibrillation, unspecified: Secondary | ICD-10-CM

## 2022-10-17 DIAGNOSIS — I639 Cerebral infarction, unspecified: Secondary | ICD-10-CM | POA: Diagnosis not present

## 2022-10-17 LAB — ECHOCARDIOGRAM COMPLETE
AR max vel: 2.36 cm2
AV Area VTI: 2.34 cm2
AV Area mean vel: 2.25 cm2
AV Mean grad: 4 mmHg
AV Peak grad: 7.3 mmHg
Ao pk vel: 1.35 m/s
Area-P 1/2: 3.17 cm2
Calc EF: 54.1 %
P 1/2 time: 585 msec
S' Lateral: 4.3 cm
Single Plane A2C EF: 55.9 %
Single Plane A4C EF: 58.3 %

## 2022-10-17 LAB — TSH: TSH: 1.976 u[IU]/mL (ref 0.350–4.500)

## 2022-10-17 LAB — LIPID PANEL
Cholesterol: 120 mg/dL (ref 0–200)
HDL: 27 mg/dL — ABNORMAL LOW (ref >40)
LDL Cholesterol: 65 mg/dL (ref 0–99)
Total CHOL/HDL Ratio: 4.4 RATIO
Triglycerides: 139 mg/dL (ref <150)
VLDL: 28 mg/dL (ref 0–40)

## 2022-10-17 LAB — HEMOGLOBIN A1C
Hgb A1c MFr Bld: 5.7 % — ABNORMAL HIGH (ref 4.8–5.6)
Mean Plasma Glucose: 117 mg/dL

## 2022-10-17 MED ORDER — ASPIRIN 81 MG PO TBEC: 81.0000 mg | DELAYED_RELEASE_TABLET | Freq: Every day | ORAL | 0 refills | Status: AC

## 2022-10-17 MED ORDER — APIXABAN 5 MG PO TABS: 5.0000 mg | ORAL_TABLET | Freq: Two times a day (BID) | ORAL | 11 refills | Status: AC

## 2022-10-17 NOTE — Evaluation (Signed)
Physical Therapy Evaluation Patient Details Name: Lauren Wheeler MRN: [id number] DOB: Jul 28, 1927 Today's Date: 10/17/2022  History of Present Illness  Pt admitted to [facility] on 10/16/22 under observation for c/o stroke-like symptoms including: confusion, R sided visual deficits, HA, and dysarthria. Pt outside window for TNK. Imaging significant for: acute infarct of L hippocampal region, body of caudate nucleus; thrombus in P2 segment of L PCA. Significant PMH includes: HLD, HTN, essential tremor, Afib, and interstitial lung disease.   Clinical Impression  Pt is a 88 year old F admitted to hospital on 10/16/22 for acute CVA. At baseline, pt is mod I for ADL's, simple meals, medication management, and ambulation with rollator; daughters to assist with IADL's and transportation.   Pt presents with mild unsteadiness in standing with gait, but otherwise has adequate strength, endurance, sensation, and insight into deficits required for safe functional mobility. She was mod I for bed mobility, supervision for transfers with RW, and supervision-CGA for safety to ambulate 70f with RW. Pt and daughter currently noting that pt's functional mobility is near baseline, and that she is no longer experiencing any deficits (dysarthria, R visual field impairment, HA, and confusion).   Due to pt being near baseline without deficits, she is not an appropriate candidate at this time. PT to sign off; please re-consult as appropriate. She is appropriate for Ind ambulation in room with family members, and ambulation in hallway with nursing staff and mobility specialist while hospitalized. At this time, PT recommends HHPT to address deficits at home, to ensure safety/ability to perform ADL's and IADL's. Family and pt agreeable.        Recommendations for follow up therapy are one component of a multi-disciplinary discharge planning process, led by the attending physician.  Recommendations may be updated based on patient  status, additional functional criteria and insurance authorization.  Follow Up Recommendations Home health PT      Assistance Recommended at Discharge Intermittent Supervision/Assistance  Patient can return home with the following  Assistance with cooking/housework;A little help with bathing/dressing/bathroom;Help with stairs or ramp for entrance    Equipment Recommendations None recommended by PT     Functional Status Assessment Patient has had a recent decline in their functional status and demonstrates the ability to make significant improvements in function in a reasonable and predictable amount of time.     Precautions / Restrictions Precautions Precautions: Fall Restrictions Weight Bearing Restrictions: No Other Position/Activity Restrictions: SpO2 >/= 94%, permissive HTN up to <220/110 first 48hrs      Mobility  Bed Mobility Overal bed mobility: Modified Independent             General bed mobility comments: HOB semi-elevated, use of BUE for support    Transfers Overall transfer level: Needs assistance Equipment used: Rolling walker (2 wheels) Transfers: Sit to/from Stand Sit to Stand: Supervision           General transfer comment: for safety to stand from EOB with RW    Ambulation/Gait Ambulation/Gait assistance: Supervision, Min guard Gait Distance (Feet): 50 Feet           General Gait Details: Supervision for safety to ambulate with RW. Demonstrates kyphotic posture, step to gait pattern with large amplitude, and x1 LOB which she was able to self correct with appropriate stepping reactive strategy. Daughter and pt note that gait looks at baseline.     Balance Overall balance assessment: Mild deficits observed, not formally tested   Sitting balance-Leahy Scale: Normal  Standing balance support: Bilateral upper extremity supported, During functional activity Standing balance-Leahy Scale: Good                                Pertinent Vitals/Pain Pain Assessment Pain Assessment: Faces Faces Pain Scale: Hurts a little bit Pain Location: legs, BIL Pain Intervention(s): Monitored during session    Home Living Family/patient expects to be discharged to:: Private residence Living Arrangements: Alone Available Help at Discharge: Family Type of Home: House Home Access: Ramped entrance       Home Layout: One level Home Equipment: Rollator (4 wheels);BSC/3in1;Shower seat;Grab bars - tub/shower;Other (comment) (Pt has lift recliner; states she does not use lift feature currently) Additional Comments: Patient lives alone; two daughters live within a mile from her.    Prior Function Prior Level of Function : Independent/Modified Independent             Mobility Comments: MOD (I) with rollator. Denies falls in the past 3 months. Has had falls in the past, but doing well lately. Pt reports baseline pain (like a cramping) in BIL LE, waxes and wanes, but mostly constant. Pt states she typically walks 3,000 steps per day. ADLs Comments: (I) ADLs at baseline. Daughters assist with driving and grocery shopping. Pt (I) for simple meal preparation (microwave meals). Pt is very adept at using iPad and iPhone. Enjoys reading on her ipad, games on her ipad (word games), some sewing. (I) with medication management.     Hand Dominance   Dominant Hand: Right    Extremity/Trunk Assessment   Upper Extremity Assessment Upper Extremity Assessment: Defer to OT evaluation    Lower Extremity Assessment Lower Extremity Assessment: Overall WFL for tasks assessed (grossly 4+/5; superficial (light touch), deep (proprioception), and combined cortical (graphesthesia) intact; coordination intact)    Cervical / Trunk Assessment Cervical / Trunk Assessment: Kyphotic  Communication   Communication: HOH  Cognition Arousal/Alertness: Awake/alert Behavior During Therapy: WFL for tasks assessed/performed Overall Cognitive  Status: Within Functional Limits for tasks assessed                                 General Comments: A&O x4; able to follow 100% of 3-step commands           Exercises Other Exercises Other Exercises: Participates in bed mobility, transfers, and gait with supervision-mod I. Notes that functional mobility is near baseline, without significant deficits. Other Exercises: Pt and daughter educated re: PT role/POC, DC recommendations, ambulating with family to/from bathroom in room, ambulating with nursing/mobility tech in hallway, sitting to bathe at home, and initial supervision/assist as needed at DC.   Assessment/Plan    PT Assessment All further PT needs can be met in the next venue of care         PT Goals (Current goals can be found in the Care Plan section)  Acute Rehab PT Goals Patient Stated Goal: "go home" PT Goal Formulation: With patient/family Time For Goal Achievement: 10/31/22 Potential to Achieve Goals: Good     AM-PAC PT "6 Clicks" Mobility  Outcome Measure Help needed turning from your back to your side while in a flat bed without using bedrails?: None Help needed moving from lying on your back to sitting on the side of a flat bed without using bedrails?: None Help needed moving to and from a bed to a chair (including a  wheelchair)?: A Little Help needed standing up from a chair using your arms (e.g., wheelchair or bedside chair)?: A Little Help needed to walk in hospital room?: A Little Help needed climbing 3-5 steps with a railing? : A Little 6 Click Score: 20    End of Session Equipment Utilized During Treatment: Gait belt Activity Tolerance: Patient tolerated treatment well Patient left: in chair;with family/visitor present Nurse Communication: Mobility status      Time: 5790-3833 PT Time Calculation (min) (ACUTE ONLY): 22 min   Charges:   PT Evaluation $PT Eval Low Complexity: [place], PT, DPT 2:39  PM,10/17/22 Physical Therapist - [facility]

## 2022-10-17 NOTE — Progress Notes (Signed)
  Echocardiogram 2D Echocardiogram has been performed.  Lauren Wheeler 10/17/2022, 2:44 PM

## 2022-10-17 NOTE — TOC Progression Note (Signed)
Transition of Care [facility]) - Progression Note    Patient Details  Name: Lauren Wheeler MRN: [id number] Date of Birth: July 01, 1927  [facility] CM/SW Contact  Tempie Hoist, [place] Phone Number: 10/17/2022, 4:07 PM  Clinical Narrative:     Patient will need HHPT at discharge. TOC reached out to [facility].   Expected Discharge Plan: Home w Home Health Services    Expected Discharge Plan and Services   Discharge Planning Services: CM Consult Post Acute Care Choice: Home Health                             HH Arranged: PT           Social Determinants of Health (SDOH) Interventions SDOH Screenings   Food Insecurity: No Food Insecurity (10/17/2022)  Housing: Low Risk  (10/17/2022)  Transportation Needs: No Transportation Needs (10/17/2022)  Utilities: Not At Risk (10/17/2022)  Tobacco Use: Low Risk  (10/16/2022)    Readmission Risk Interventions     No data to display

## 2022-10-17 NOTE — TOC Initial Note (Signed)
Transition of Care [facility]) - Initial/Assessment Note    Patient Details  Name: Lauren Wheeler MRN: [id number] Date of Birth: Mar 29, 1927  [facility]) CM/SW Contact:    Tempie Hoist, LCSWA Phone Number: 10/17/2022, 3:55 PM  Clinical Narrative:                  TOC assessment completed with Dewayne Hatch [phone] over the phone. The patient attends [facility].Her pharmacy is [facility]. She will need HHPT at discharge. Dewayne Hatch drives the patient to appointments.  Expected Discharge Plan: Home w Home Health Services     Patient Goals and CMS Choice         HHPT   Expected Discharge Plan and Services   Discharge Planning Services: CM Consult Post Acute Care Choice: Home Health                             HH Arranged: PT          Prior Living Arrangements/Services     Patient language and need for interpreter reviewed:: No        Need for Family Participation in Patient Care: Yes (Comment) Care giver support system in place?: Yes (comment)      Activities of Daily Living Home Assistive Devices/Equipment: Eyeglasses, Hearing aid, Walker (specify type) ADL Screening (condition at time of admission) Patient's cognitive ability adequate to safely complete daily activities?: Yes Is the patient deaf or have difficulty hearing?: No Does the patient have difficulty seeing, even when wearing glasses/contacts?: No Does the patient have difficulty concentrating, remembering, or making decisions?: No Patient able to express need for assistance with ADLs?: Yes Does the patient have difficulty dressing or bathing?: No Independently performs ADLs?: Yes (appropriate for developmental age) Does the patient have difficulty walking or climbing stairs?: No Weakness of Legs: Both Weakness of Arms/Hands: None  Permission Sought/Granted Permission sought to share information with : Facility Medical sales representative, Family Supports    Share Information  with NAME: Dewayne Hatch [phone]           Emotional Assessment              Admission diagnosis:  Acute CVA (cerebrovascular accident) [facility]) [I63.9] Cerebrovascular accident (CVA), unspecified mechanism (HCC) [I63.9] Patient Active Problem List   Diagnosis Date Noted   Acute CVA (cerebrovascular accident) (HCC) 10/16/2022   Interstitial lung disease (HCC) 11/30/2021   Acute respiratory failure with hypoxia (HCC) 11/29/2021   Left leg pain 11/29/2021   Acute gastric ulcer    GI bleeding 12/20/2020   Atrial fibrillation, chronic (HCC) 12/20/2020   Elevated INR 12/20/2020   Leukocytosis 12/20/2020   Elevated lactic acid level 12/20/2020   Blood loss anemia 12/20/2020   Abdominal pain 12/20/2020   Pressure injury of skin 12/20/2020   Essential hypertension    Hyperlipidemia, unspecified    Essential tremor    Benign essential hypertension 11/08/2009   PCP:  [employer] Pharmacy:   [facility] - [place], [place] - [address] [place] [address] Phone: [phone] Fax: [phone]     Social Determinants of Health (SDOH) Social History: SDOH Screenings   Food Insecurity: No Food Insecurity (10/17/2022)  Housing: Low Risk  (10/17/2022)  Transportation Needs: No Transportation Needs (10/17/2022)  Utilities: Not At Risk (10/17/2022)  Tobacco Use: Low Risk  (10/16/2022)   SDOH Interventions:     Readmission Risk Interventions  No data to display

## 2022-10-17 NOTE — ED Notes (Signed)
Hello, The patient just got a bed upstairs and this nurse went into the room to notify pt and family. Family states that they would rather see a doctor and take the pt home. Also to talk the provider about the MRI results at this time.

## 2022-10-17 NOTE — Plan of Care (Signed)
  Problem: Education: Goal: Knowledge of disease or condition will improve Outcome: Progressing Goal: Knowledge of secondary prevention will improve (MUST DOCUMENT ALL) Outcome: Progressing Goal: Knowledge of patient specific risk factors will improve Loraine Leriche N/A or DELETE if not current risk factor) Outcome: Progressing   Problem: Ischemic Stroke/TIA Tissue Perfusion: Goal: Complications of ischemic stroke/TIA will be minimized Outcome: Progressing   Problem: Health Behavior/Discharge Planning: Goal: Ability to manage health-related needs will improve Outcome: Progressing Goal: Goals will be collaboratively established with patient/family Outcome: Progressing   Problem: Self-Care: Goal: Ability to participate in self-care as condition permits will improve Outcome: Progressing Goal: Verbalization of feelings and concerns over difficulty with self-care will improve Outcome: Progressing Goal: Ability to communicate needs accurately will improve Outcome: Progressing   Problem: Education: Goal: Knowledge of General Education information will improve Description: Including pain rating scale, medication(s)/side effects and non-pharmacologic comfort measures Outcome: Progressing   Problem: Clinical Measurements: Goal: Ability to maintain clinical measurements within normal limits will improve Outcome: Progressing   Problem: Activity: Goal: Risk for activity intolerance will decrease Outcome: Progressing   Problem: Elimination: Goal: Will not experience complications related to bowel motility Outcome: Progressing Goal: Will not experience complications related to urinary retention Outcome: Progressing   Problem: Pain Managment: Goal: General experience of comfort will improve Outcome: Progressing   Problem: Safety: Goal: Ability to remain free from injury will improve Outcome: Progressing

## 2022-10-17 NOTE — Evaluation (Signed)
Occupational Therapy Evaluation Patient Details Name: Lauren Wheeler MRN: [id number] DOB: [birth year] Today's Date: 10/17/2022   History of Present Illness Pt is 88 y/o female with symptoms of HA, R visual deficits, inability to use TV remote.   Clinical Impression   Patient received for OT evaluation. See flowsheet below for details of function. Generally, patient requiring SBA for bed mobility, SBA for functional mobility with RW, and MOD (I) for ADLs. Patient with no further need for OT in acute care; discharge OT services.      Recommendations for follow up therapy are one component of a multi-disciplinary discharge planning process, led by the attending physician.  Recommendations may be updated based on patient status, additional functional criteria and insurance authorization.   Follow Up Recommendations  No OT follow up     Assistance Recommended at Discharge Intermittent Supervision/Assistance  Patient can return home with the following Assistance with cooking/housework;Assist for transportation    Functional Status Assessment  Patient has not had a recent decline in their functional status  Equipment Recommendations  None recommended by OT    Recommendations for Other Services       Precautions / Restrictions Precautions Precautions: Fall Restrictions Weight Bearing Restrictions: No      Mobility Bed Mobility Overal bed mobility: Modified Independent (pt using bed rail, which she does not have at home)             General bed mobility comments: from semi-reclined, pt using bed rail to help self to EOB; no physical assist.    Transfers Overall transfer level: Needs assistance Equipment used: Rolling walker (2 wheels) Transfers: Sit to/from Stand Sit to Stand: Supervision                  Balance Overall balance assessment: Needs assistance         Standing balance support: Bilateral upper extremity supported                                ADL either performed or assessed with clinical judgement   ADL Overall ADL's : At baseline                                       General ADL Comments: MOD (I); pt with slight weakness from baseline, per her report. Pt states she had diarrhea earlier this week and that has made her less mobile. Pt is able to demonstrate how she can don/doff BIL socks today; able to walk around the room with RW (which usually she uses rollator, so she is a awkward with RW- holding it too far in front). Pt reports feeling confident that she can do her ADLs per usual.     Vision Baseline Vision/History: 1 Wears glasses Ability to See in Adequate Light: 0 Adequate Patient Visual Report: No change from baseline Vision Assessment?: Yes Ocular Range of Motion: Within Functional Limits Tracking/Visual Pursuits: Decreased smoothness of horizontal tracking;Decreased smoothness of vertical tracking Visual Fields: No apparent deficits     Perception     Praxis Praxis Praxis tested?: Within functional limits    Pertinent Vitals/Pain Pain Assessment Pain Assessment: 0-10 Pain Score:  (unrated; low) Pain Location: legs, BIL Pain Descriptors / Indicators: Cramping Pain Intervention(s): Limited activity within patient's tolerance     Hand Dominance Right   Extremity/Trunk Assessment Upper Extremity  Assessment Upper Extremity Assessment: Generalized weakness (LUE slightlymore weakness that RUE, but BIL UE functional. Coordination intact. Sensation intact)   Lower Extremity Assessment Lower Extremity Assessment: Defer to PT evaluation       Communication Communication Communication: HOH   Cognition Arousal/Alertness: Awake/alert Behavior During Therapy: WFL for tasks assessed/performed Overall Cognitive Status: Within Functional Limits for tasks assessed                                 General Comments: Pt states that sometimes she cannot pull up names for  things (like her medications), but always knows which ones to take by the look of them. A+Ox4 today.     General Comments       Exercises     Shoulder Instructions      Home Living Family/patient expects to be discharged to:: Private residence Living Arrangements: Alone Available Help at Discharge: Family Type of Home: House Home Access: [place]: One level     Bathroom Shower/Tub: Walk-in shower;Door   [employer] Toilet: Handicapped height Bathroom Accessibility: Yes How Accessible: Accessible via walker Home Equipment: Rollator (4 wheels);BSC/3in1;Shower seat;Grab bars - tub/shower;Other (comment) (Pt has lift recliner; states she does not use lift feature currently)   Additional Comments: Patient lives alone; two daughters live within a mile from her.      Prior Functioning/Environment Prior Level of Function : Independent/Modified Independent             Mobility Comments: MOD (I) with rollator. Denies falls in the past 3 months. Has had falls in the past, but doing well lately. Pt reports baseline pain (like a cramping) in BIL LE, waxes and wanes, but mostly constant. Pt states she typically walks 3,000 steps per day. ADLs Comments: (I) ADLs at baseline. Daughters assist with driving and grocery shopping. Pt (I) for simple meal preparation (microwave meals). Pt is very adept at using iPad and iPhone. Enjoys reading on her ipad, games on her ipad (word games), some sewing. (I) with medication management.        OT Problem List:        OT Treatment/Interventions:      OT Goals(Current goals can be found in the care plan section) Acute Rehab OT Goals Patient Stated Goal: Go home. OT Goal Formulation: With patient/family  OT Frequency:      Co-evaluation              AM-PAC OT "6 Clicks" Daily Activity     Outcome Measure Help from another person eating meals?: None Help from another person taking care of personal grooming?:  None Help from another person toileting, which includes using toliet, bedpan, or urinal?: None Help from another person bathing (including washing, rinsing, drying)?: None Help from another person to put on and taking off regular upper body clothing?: None Help from another person to put on and taking off regular lower body clothing?: None 6 Click Score: 24   End of Session Equipment Utilized During Treatment: Gait belt;Rolling walker (2 wheels) Nurse Communication: Mobility status  Activity Tolerance: Patient tolerated treatment well;Patient limited by fatigue Patient left: in bed;with family/visitor present  OT Visit Diagnosis: Muscle weakness (generalized) (M62.81)                Time: [id number] OT Time Calculation (min): 27 min Charges:  OT General Charges $OT Visit: 1 Visit OT Evaluation $OT Eval Moderate  Complexity: 1 Mod  Waymon Amato, MS, OTR/L   Vania Rea 10/17/2022, 10:05 AM

## 2022-10-17 NOTE — Progress Notes (Signed)
Neurology Progress Note  Interval hx: MRI brain showed infarcts involving L hippocampal region and body of the caudate (personal review). Patient is feeling well. No new neurologic complaints. Confusion has improved but not resolved per daughters  Exam: Vitals:   10/17/22 0600 10/17/22 1016  BP: 122/60 (!) 156/65  Pulse: 66 70  Resp: 18 16  Temp: 98 F (36.7 C) 98.2 F (36.8 C)  SpO2: 98% 100%    Physical Exam Gen: A&Ox4, NAD HEENT: Atraumatic, normocephalic; oropharynx clear, tongue without atrophy or fasciculations. Resp: CTAB, normal work of breathing CV: RRR, extremities appear well-perfused. Abd: soft/NT/ND Extrem: Nml bulk; no cyanosis, clubbing, or edema.  Neuro: *MS: A&O x4. Follows multi-step commands.  *Speech: no dysarthria or aphasia, able to name and repeat. *CN:    I: Deferred   II,III: PERRLA, VFF by confrontation, optic discs not visualized 2/2 pupillary constriction   III,IV,VI: EOMI w/o nystagmus, no ptosis   V: Sensation intact from V1 to V3 to LT   VII: Eyelid closure was full.  Smile symmetric.   VIII: Hearing intact to voice   IX,X: Voice normal, palate elevates symmetrically    XI: SCM/trap 5/5 bilat   XII: Tongue protrudes midline, no atrophy or fasciculations  *Motor:   Normal bulk.  No tremor, rigidity or bradykinesia. No pronator drift.   Strength: Dlt Bic Tri WE WrF FgS Gr HF KnF KnE PlF DoF    Left 5 5 5 5 5 5 5 5 5 5 5 5     Right 5 5 5 5 5 5 5 5 5 5 5 5    *Sensory: Intact to light touch, pinprick, temperature vibration throughout. Symmetric. Propioception intact bilat.  No double-simultaneous extinction.  *Coordination:  Finger-to-nose, heel-to-shin, rapid alternating motions were intact. *Reflexes:  2+ and symmetric throughout without clonus; toes down-going bilat *Gait: deferred  NIHSS = 0  Data:  MRI brain wo contrast  1. Acute infarcts involving the left hippocampal region and body of the caudate nucleus. 2. Focus of  susceptibility artifact in the region of known thrombus in the P2 segment of the left PCA.  CTA neck:   1. The common carotid and internal carotid arteries are patent within the neck without hemodynamically significant stenosis. Atherosclerotic plaque bilaterally, as described. 2. Beaded irregularity of the mid-to-distal cervical left ICA, likely reflecting sequela of fibromuscular dysplasia. 3. The vertebral arteries codominant and patent within the neck without stenosis. 4.  Aortic Atherosclerosis (ICD10-I70.0).   CTA head:   Abrupt occlusion of the left posterior cerebral artery at the P2 segment level. Correlate for signs/symptoms of acute left PCA territory infarct.  CNS imaging personally reviewed; I agree with above interpretations  Stroke Labs     Component Value Date/Time   CHOL 120 10/17/2022 0614   TRIG 139 10/17/2022 0614   HDL 27 (L) 10/17/2022 0614   CHOLHDL 4.4 10/17/2022 0614   VLDL 28 10/17/2022 0614   LDLCALC 65 10/17/2022 0614    No results found for: "HGBA1C"   A/P: This is a 88 yo woman with pmhx HTN, HL, essential tremor, hx a fib not on anticoagulation 2/2 prior GI bleeds who presents after episode of L facial pain, visual disturbance, and confusion.  MRI brain confirmed acute infarcts involving the left hippocampal region and body of the caudate nucleus.  She has not been on anticoagulation for her A-fib due to a GI bleed in 2022.  At that time she presented with tarry stools and EGD was performed which showed  a nonbleeding gastric ulcer with visible vessel which was injected and treated with bipolar cautery.  She did not require blood transfusion or ICU stay at that time.  Ulcer was felt to be secondary to excess ibuprofen use (800 mg twice daily for an extended period of time).  Thus the source of the GI bleed appears to have been addressed therefore lowering the risk of recurrent GI bleed on or off anticoagulation.    Her CHA2DS2-VASc score is 6  associated with 9.7% annual risk of stroke off anticoagulation.  Her HASBLED score is 4 associated with a 8.9% annual risk of bleeding, although because the risk factor for her bleeding was eliminated and the ulcer was treated her bleeding risk is felt to be lower than that. I have discussed the risks and benefits of anticoagulation with her 2 daughters at length and we agreed that she would be an appropriate candidate for anticoagulation going forward.  The stroke is small and daughters are extremely vigilant so I do feel comfortable that she would be an appropriate candidate to start Eliquis as an outpatient at day 3 from stroke as long as her symptoms are stable or improved.  They understand that if her symptoms worsen that they should immediately seek medical attention again.  - OK to discharge patient home - Daughters instructed to give her single ASA 81mg  daily tomorrow then start eliquis for a fib on Sunday. She should stop aspirin when she starts eliquis - I will arrange outpatient neurology f/u   Sunday, MD [facility] [phone]  If 7pm- 7am, please page neurology on call as listed in AMION.  **Any copied and pasted documentation in this note was written by me in another application not billed for and pasted by me into this document.

## 2022-10-17 NOTE — Progress Notes (Addendum)
SLP Cancellation Note  Patient Details Name: Lauren Wheeler MRN: [id number] DOB: Jan 05, 1927   Cancelled treatment:       Reason Eval/Treat Not Completed: SLP screened, no needs identified, will sign off (chart reviewed; consulted NSG and met w/ pt and Dtr in room.) Pt receiving Echocardiogram. She was A/O x3; verbally communicated w/ this SLP and staff. She denied any difficulty swallowing and is currently on a regular diet; tolerates swallowing pills w/ water per NSG. Dtr present stated pt had eaten her entire lunch meal w/ No difficulty -- plate cleared. Pt has been conversing in conversation w/ Dtr and staff w/out expressive/receptive deficits noted; pt denied any speech-language deficits. Speech clear. No further skilled ST services indicated as pt appears at her baseline. Pt and Dtr agreed. NSG to reconsult if any change in status while admitted.      Orinda Kenner, MS, CCC-SLP Speech Language Pathologist Rehab Services; [place] [phone] (ascom) Rodel Glaspy 10/17/2022, 2:00 PM

## 2022-10-17 NOTE — Discharge Summary (Signed)
[facility] Discharge Summary   Patient: Lauren Wheeler WGN:[id number]  [facility]  Date of admission: 10/16/2022   Date of discharge:  10/17/2022     Discharge Diagnoses:  Principal Problem:   Acute CVA (cerebrovascular accident) [facility](HCC) Active Problems:   Essential hypertension   Essential tremor   Atrial fibrillation, chronic (HCC)   Admitted From: Home Disposition:  Home HH/PT  Recommendations for Outpatient Follow-up:  PCP: in 1 wk F/u Neuro in 1-2 wks Follow up LABS/TEST:     Diet recommendation: Cardiac diet  Activity: The patient is advised to gradually reintroduce usual activities, as tolerated  Discharge Condition: stable  Code Status: Full code   History of present illness: As per the H and P dictated on admission,  Hospital Course:  Lauren Wheeler is a 88 y.o. female with medical history significant for essential tremor, dyslipidemia and hypertension who arrives the emergency room via private vehicle for evaluation of strokelike symptoms. Patient's last known well was about 8:30 AM when her daughter saw her this morning.  Patient notes that she developed symptoms which she described as a headache, right-sided visual difficulties and not being able to do the things she would usually do like putting the TV on.  And patient was she was unable to use the remote control but denies any weakness of her upper or lower extremities.  She texted her daughter who came to the house promptly and brought her to the emergency room for evaluation. A code stroke was called upon her arrival and she was seen emergently by neurology.  She was not a candidate for thrombolytics as she was outside the window. Patient feels better and states that her headache has resolved.  Her vision has also improved and she is able to move all extremities. She denies having any chest pain, no shortness of breath, no nausea, no vomiting, no dizziness, no lightheadedness, no abdominal  pain, no changes in her bowel habits, no urinary symptoms, no blurred vision no focal deficit. CT scan of the head without contrast shows asymmetric hyperdensity of the left PCA P2 segment, likely reflecting endoluminal thrombus or embolus given the provided history of right eye vision loss. A CTA is recommended for further evaluation. No evidence of acute intracranial hemorrhage or acute infarct. Mild generalized cerebral atrophy. CT angiogram of the head and neck shows the common carotid and internal carotid arteries are patent within the neck without hemodynamically significant stenosis. Atherosclerotic plaque bilaterally, as described. Beaded irregularity of the mid-to-distal cervical left ICA, likely reflecting sequela of fibromuscular dysplasia. The vertebral arteries codominant and patent within the neck without stenosis. Aortic Atherosclerosis. 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 Correlate for signs/symptoms of acute left PCA territory infarct. Twelve-lead EKG reviewed by me shows sinus rhythm with PACs Patient will be referred to observation status for further evaluation.   Assessment and Plan: # Acute CVA (cerebrovascular accident)  Patient with a history of atrial fibrillation not on anticoagulation due to history of GI bleed who presents for evaluation of strokelike symptoms. Code stroke was called upon patient's arrival to the ER and she was seen emergently by neurologist. Initial CT scan of the head showed asymmetric hyperdensity of the left PCA P2 segment, likely reflecting endoluminal thrombus or embolus given the provided history of right eye vision loss.  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 Correlate for signs/symptoms of acute left  PCA territory infarct. MRI 1. Acute infarcts involving the left hippocampal region and body of the caudate nucleus. 2. Focus of susceptibility artifact  in the region of known thrombus in the P2 segment of the left PCA. TTE shows LVEF 55 to 60%, no wall motion abnormity, moderate LV hypertrophy.  No atrial shunt detected by color-flow Doppler.  SLP cleared for diet, PT and OT recommended home health PT which was arranged by case manager.  LDL 65, TSH 1.9, A1c 5.7 prediabetic.  Patient's symptoms has been resolved.  Neurology recommended aspirin for first 3 days, patient was given an additional 1 day supply on discharge and advised to resume Eliquis 5 mg p.o. twice daily on 10/19/2022.  Very less risk of hemorrhagic conversion as per neurology.  Patient was cleared for discharge and follow-up as an outpatient. # Atrial fibrillation, chronic, Not on anticoagulation due to history of GI bleed 2 years ago.  As per patient's daughter she had a gastric ulcer due to ibuprofen 800 mg p.o. twice daily which patient was taking for several years.  GI workup was done and ulcer was cauterized.  No bleeding since last 2 years.  Patient's daughter agreed to restart anticoagulation so patient was started on Eliquis 5 mg p.o. twice daily.  Risk and benefit explained, advised to return back to ED if any GI bleeding. # Essential tremor, Chronic.  Stable # Essential hypertension, resumed home meds.  There is no height or weight on file to calculate BMI.   Nutrition Interventions:   Pressure Injury 12/20/20 Buttocks Right Stage 1 -  Intact skin with non-blanchable redness of a localized area usually over a bony prominence. (Active)  12/20/20 1045  Location: Buttocks  Location Orientation: Right  Staging: Stage 1 -  Intact skin with non-blanchable redness of a localized area usually over a bony prominence.  Wound Description (Comments):   Present on Admission: Yes      Patient was seen by physical therapy, who recommended Home health, which was arranged. On the day of the discharge the patient's vitals were stable, and no other acute medical condition were reported by  patient. the patient was felt safe to be discharge at Home with Home health.  Consultants: Neurology Procedures: None  Discharge Exam: General: Appear in no distress, no Rash; Oral Mucosa Clear, moist. Cardiovascular: S1 and S2 Present, no Murmur, Respiratory: normal respiratory effort, Bilateral Air entry present and no Crackles, no wheezes Abdomen: Bowel Sound present, Soft and no tenderness, no hernia Extremities: no Pedal edema, no calf tenderness Neurology: alert and oriented to time, place, and person affect appropriate.  There were no vitals filed for this visit. Vitals:   10/17/22 1016 10/17/22 1153  BP: (!) 156/65 (!) 147/87  Pulse: 70 70  Resp: 16 16  Temp: 98.2 F (36.8 C) 98.6 F (37 C)  SpO2: 100% 100%    DISCHARGE MEDICATION: Allergies as of 10/17/2022       Reactions   Hydrochlorothiazide Other (See Comments)   Hyponatremia   Diltiazem Hcl Rash        Medication List     STOP taking these medications    albuterol 108 (90 Base) MCG/ACT inhaler Commonly known as: VENTOLIN HFA   fluorouracil 5 % cream Commonly known as: EFUDEX       TAKE these medications    apixaban 5 MG Tabs tablet Commonly known as: ELIQUIS Take 1 tablet (5 mg total) by mouth 2 (two) times daily. Start taking on: October 19, 2022  aspirin EC 81 MG tablet Take 1 tablet (81 mg total) by mouth daily for 1 day. Swallow whole. Start taking on: October 18, 2022   atorvastatin 10 MG tablet Commonly known as: LIPITOR Take 10 mg by mouth at bedtime.   cetirizine 5 MG tablet Commonly known as: ZYRTEC Take 5 mg by mouth daily as needed for allergies.   clobetasol 0.05 % external solution Commonly known as: TEMOVATE Spot treat areas at scalp as needed for itch once daily. Avoid applying to face, groin, and axilla. Use as directed. Long-term use can cause thinning of the skin.   cyanocobalamin 1000 MCG tablet Take 1 tablet (1,000 mcg total) by mouth daily.    Iron-Vitamin C 65-125 MG Tabs Take 1 tablet by mouth daily.   ketoconazole 2 % shampoo Commonly known as: NIZORAL 2-3 times per week lather on scalp and ears, rinse well.   pantoprazole 40 MG tablet Commonly known as: PROTONIX Take 1 tablet (40 mg total) by mouth 2 (two) times daily before a meal. What changed: when to take this   propranolol 40 MG tablet Commonly known as: INDERAL Take 40 mg by mouth 2 (two) times daily.   traMADol 50 MG tablet Commonly known as: ULTRAM Take 1 tablet (50 mg total) by mouth every 12 (twelve) hours as needed for severe pain. What changed:  when to take this reasons to take this       Allergies  Allergen Reactions   Hydrochlorothiazide Other (See Comments)    Hyponatremia   Diltiazem Hcl Rash     The results of significant diagnostics from this hospitalization (including imaging, microbiology, ancillary and laboratory) are listed below for reference.    Significant Diagnostic Studies: MR BRAIN WO CONTRAST  Result Date: 10/16/2022 CLINICAL DATA:  Neuro deficit.  Acute stroke suspected. EXAM: MRI HEAD WITHOUT CONTRAST TECHNIQUE: Multiplanar, multiecho pulse sequences of the brain and surrounding structures were obtained without intravenous contrast. COMPARISON:  Same day CT brain and CT head/neck angiogram. FINDINGS: Brain: There are acute infarcts involving the left hippocampal region (series 5, image 20-22) and along the body of the caudate nucleus (series 5, image 27). There is a focus of susceptibility artifact in the region of known thrombus in the P2 segment of the left PCA. No other evidence of intracranial hemorrhage. No hydrocephalus. There is sequela of mild chronic microvascular ischemic change. There is generalized volume loss that is appropriate for age. Vascular: Normal flow voids. Please see prior CTA head and neck angiogram for characterization of the known thrombus in the P2 segment of the left PCA. Skull and upper cervical  spine: Normal marrow signal. Sinuses/Orbits: Bilateral lens replacement. Other: None IMPRESSION: 1. Acute infarcts involving the left hippocampal region and body of the caudate nucleus. 2. Focus of susceptibility artifact in the region of known thrombus in the P2 segment of the left PCA. Electronically Signed   By: Lorenza Cambridge M.D.   On: 10/16/2022 18:51   CT ANGIO HEAD NECK W WO CM  Result Date: 10/16/2022 CLINICAL DATA:  Provided history: Neuro deficit, acute, stroke suspected. EXAM: CT ANGIOGRAPHY HEAD AND NECK TECHNIQUE: Multidetector CT imaging of the head and neck was performed using the standard protocol during bolus administration of intravenous contrast. Multiplanar CT image reconstructions and MIPs were obtained to evaluate the vascular anatomy. Carotid stenosis measurements (when applicable) are obtained utilizing NASCET criteria, using the distal internal carotid diameter as the denominator. RADIATION DOSE REDUCTION: This exam was performed according to the departmental dose-optimization  program which includes automated exposure control, adjustment of the mA and/or kV according to patient size and/or use of iterative reconstruction technique. CONTRAST:  75mL OMNIPAQUE IOHEXOL 350 MG/ML SOLN COMPARISON:  Noncontrast head CT performed earlier today 10/16/2022. FINDINGS: CTA NECK FINDINGS Aortic arch: Common origin of the innominate and left common carotid arteries. Atherosclerotic plaque within the visualized aortic arch and proximal major branch vessels of the neck. Streak and beam hardening artifact arising from a dense right-sided contrast bolus partially obscures the right subclavian artery. Within this limitation, there is no appreciable hemodynamically significant innominate or proximal subclavian artery stenosis. Right carotid system: CCA and ICA patent within the neck without hemodynamically significant stenosis. Atherosclerotic plaque about the carotid bifurcation and within the proximal  ICA. Left carotid system: CCA and ICA patent within the neck without hemodynamically significant stenosis. Atherosclerotic plaque at the CCA origin, about the carotid bifurcation and within the proximal ICA. Prominent beaded irregularity of the mid to distal cervical ICA likely reflecting sequela of fibromuscular dysplasia. Vertebral arteries: Vertebral arteries codominant and patent within the neck without stenosis. Nonstenotic atherosclerotic plaque at the origin of both vessels. Skeleton: Cervical spondylosis. No acute fracture or aggressive osseous lesion. Other neck: No neck mass or cervical lymphadenopathy. Upper chest: No consolidation within the imaged lung apices. Review of the MIP images confirms the above findings CTA HEAD FINDINGS Anterior circulation: The intracranial internal carotid arteries are patent. Nonstenotic atherosclerotic plaque within both vessels. The M1 middle cerebral arteries are patent. Atherosclerotic irregularity of the M2 and more distal MCA vessels, bilaterally. No M2 proximal branch occlusion or high-grade proximal stenosis is identified. The anterior cerebral arteries are patent. No intracranial aneurysm is identified. Posterior circulation: The intracranial vertebral arteries are patent. The basilar artery is patent. The right PCA is patent. There is right PCA distal branch atherosclerotic irregularity. Abrupt occlusion of the left PCA at the P2 segment level (series 9, image 46). There is some reconstitution of enhancement within the distal left PCA branches, likely due to collateral flow. Posterior communicating arteries are diminutive or absent, bilaterally. Venous sinuses: Within the limitations of contrast timing, no convincing thrombus. Anatomic variants: As described. Review of the MIP images confirms the above findings Findings of left posterior cerebral artery occlusion called by telephone at the time of interpretation on 10/16/2022 at 2:37 pm to provider [facility] ,  who verbally acknowledged these results. IMPRESSION: CTA neck: 1. The common carotid and internal carotid arteries are patent within the neck without hemodynamically significant stenosis. Atherosclerotic plaque bilaterally, as described. 2. Beaded irregularity of the mid-to-distal cervical left ICA, likely reflecting sequela of fibromuscular dysplasia. 3. The vertebral arteries codominant and patent within the neck without stenosis. 4.  Aortic Atherosclerosis (ICD10-I70.0). CTA head: Abrupt occlusion of the left posterior cerebral artery at the P2 segment level. Correlate for signs/symptoms of acute left PCA territory infarct. Electronically Signed   By: Jackey Loge D.O.   On: 10/16/2022 15:04   CT HEAD CODE STROKE WO CONTRAST  Result Date: 10/16/2022 CLINICAL DATA:  Code stroke. Neuro deficit, acute, stroke suspected. Dizziness. Right eye vision loss. EXAM: CT HEAD WITHOUT CONTRAST TECHNIQUE: Contiguous axial images were obtained from the base of the skull through the vertex without intravenous contrast. RADIATION DOSE REDUCTION: This exam was performed according to the departmental dose-optimization program which includes automated exposure control, adjustment of the mA and/or kV according to patient size and/or use of iterative reconstruction technique. COMPARISON:  Head CT 11/26/2005. FINDINGS: Brain: Mild generalized cerebral atrophy.  There is no acute intracranial hemorrhage. No demarcated cortical infarct. No extra-axial fluid collection. No evidence of an intracranial mass. No midline shift. Vascular: Asymmetric hyperdensity of the left PCA P2 segment likely reflecting endoluminal thrombus or embolus given the provided history of right eye vision loss (for instance as seen on series 3, image 10) (series 5, image 38). Skull: No fracture or aggressive osseous lesion. Sinuses/Orbits: No mass or acute finding within the imaged orbits. Minimal mucosal thickening scattered within the bilateral ethmoid air  cells. ASPECTS (Alberta Stroke Program Early CT Score) - Ganglionic level infarction (caudate, lentiform nuclei, internal capsule, insula, M1-M3 cortex): 7 - Supraganglionic infarction (M4-M6 cortex): 3 Total score (0-10 with 10 being normal): 10 These results were called by telephone at the time of interpretation on 10/16/2022 at 2:21 pm to provider Dr. Selina Cooley, who verbally acknowledged these results. IMPRESSION: 1. Asymmetric hyperdensity of the left PCA P2 segment, likely reflecting endoluminal thrombus or embolus given the provided history of right eye vision loss. A CTA is recommended for further evaluation. 2. No evidence of acute intracranial hemorrhage or acute infarct. 3. Mild generalized cerebral atrophy. Electronically Signed   By: Jackey Loge D.O.   On: 10/16/2022 14:21    Microbiology: No results found for this or any previous visit (from the past 240 hour(s)).   Labs: CBC: Recent Labs  Lab 10/16/22 1355  WBC 6.4  NEUTROABS 4.8  HGB 12.3  HCT 37.4  MCV 99.5  PLT 311   Basic Metabolic Panel: Recent Labs  Lab 10/16/22 1355  NA 139  K 4.1  CL 104  CO2 27  GLUCOSE 125*  BUN 21  CREATININE 0.79  CALCIUM 9.7   Liver Function Tests: Recent Labs  Lab 10/16/22 1355  AST 18  ALT 13  ALKPHOS 82  BILITOT 1.3*  PROT 7.7  ALBUMIN 4.0   No results for input(s): "LIPASE", "AMYLASE" in the last 168 hours. No results for input(s): "AMMONIA" in the last 168 hours. Cardiac Enzymes: No results for input(s): "CKTOTAL", "CKMB", "CKMBINDEX", "TROPONINI" in the last 168 hours. BNP (last 3 results) Recent Labs    11/29/21 1544  BNP 356.6*   CBG: Recent Labs  Lab 10/16/22 1355  GLUCAP 117*    Time spent: 35 minutes  Signed:  Gillis Santa  [facility]  10/17/2022 3:20 PM

## 2023-02-04 ENCOUNTER — Ambulatory Visit: Payer: Medicare PPO | Admitting: Dermatology

## 2023-02-04 VITALS — BP 133/86 | HR 83

## 2023-02-04 DIAGNOSIS — L578 Other skin changes due to chronic exposure to nonionizing radiation: Secondary | ICD-10-CM

## 2023-02-04 DIAGNOSIS — L2089 Other atopic dermatitis: Secondary | ICD-10-CM

## 2023-02-04 DIAGNOSIS — L219 Seborrheic dermatitis, unspecified: Secondary | ICD-10-CM

## 2023-02-04 DIAGNOSIS — L570 Actinic keratosis: Secondary | ICD-10-CM

## 2023-02-04 DIAGNOSIS — L821 Other seborrheic keratosis: Secondary | ICD-10-CM | POA: Diagnosis not present

## 2023-02-04 MED ORDER — CLOBETASOL PROPIONATE 0.05 % EX SOLN: CUTANEOUS | 1 refills | Status: DC

## 2023-02-04 MED ORDER — KETOCONAZOLE 2 % EX SHAM: MEDICATED_SHAMPOO | CUTANEOUS | 11 refills | Status: DC

## 2023-02-04 NOTE — Patient Instructions (Signed)
Due to recent changes in healthcare laws, you may see results of your pathology and/or laboratory studies on MyChart before the doctors have had a chance to review them. We understand that in some cases there may be results that are confusing or concerning to you. Please understand that not all results are received at the same time and often the doctors may need to interpret multiple results in order to provide you with the best plan of care or course of treatment. Therefore, we ask that you please give us 2 business days to thoroughly review all your results before contacting the office for clarification. Should we see a critical lab result, you will be contacted sooner.   If You Need Anything After Your Visit  If you have any questions or concerns for your doctor, please call our main line at [phone] and press option 4 to reach your doctor's medical assistant. If no one answers, please leave a voicemail as directed and we will return your call as soon as possible. Messages left after 4 pm will be answered the following business day.   You may also send us a message via MyChart. We typically respond to MyChart messages within 1-2 business days.  For prescription refills, please ask your pharmacy to contact our office. Our fax number is [phone].  If you have an urgent issue when the clinic is closed that cannot wait until the next business day, you can page your doctor at the number below.    Please note that while we do our best to be available for urgent issues outside of office hours, we are not available 24/7.   If you have an urgent issue and are unable to reach us, you may choose to seek medical care at your doctor's office, retail clinic, urgent care center, or emergency room.  If you have a medical emergency, please immediately call 911 or go to the emergency department.  Pager Numbers  - Dr. Kowalski: [phone]  - Dr. Moye: [phone]  - Dr. Stewart:  [phone]  In the event of inclement weather, please call our main line at [phone] for an update on the status of any delays or closures.  Dermatology Medication Tips: Please keep the boxes that topical medications come in in order to help keep track of the instructions about where and how to use these. Pharmacies typically print the medication instructions only on the boxes and not directly on the medication tubes.   If your medication is too expensive, please contact our office at [phone] option 4 or send us a message through MyChart.   We are unable to tell what your co-pay for medications will be in advance as this is different depending on your insurance coverage. However, we may be able to find a substitute medication at lower cost or fill out paperwork to get insurance to cover a needed medication.   If a prior authorization is required to get your medication covered by your insurance company, please allow us 1-2 business days to complete this process.  Drug prices often vary depending on where the prescription is filled and some pharmacies may offer cheaper prices.  The website www.goodrx.com contains coupons for medications through different pharmacies. The prices here do not account for what the cost may be with help from insurance (it may be cheaper with your insurance), but the website can give you the price if you did not use any insurance.  - You can print the associated coupon and take it with   your prescription to the pharmacy.  - You may also stop by our office during regular business hours and pick up a GoodRx coupon card.  - If you need your prescription sent electronically to a different pharmacy, notify our office through [facility] MyChart or by phone at [phone] option 4.     Si Usted Necesita Algo Despus de Su Visita  Tambin puede enviarnos un mensaje a travs de MyChart. Por lo general respondemos a los mensajes de MyChart en el transcurso de 1 a 2  das hbiles.  Para renovar recetas, por favor pida a su farmacia que se ponga en contacto con nuestra oficina. Nuestro nmero de fax es el [phone].  Si tiene un asunto urgente cuando la clnica est cerrada y que no puede esperar hasta el siguiente da hbil, puede llamar/localizar a su doctor(a) al nmero que aparece a continuacin.   Por favor, tenga en cuenta que aunque hacemos todo lo posible para estar disponibles para asuntos urgentes fuera del horario de oficina, no estamos disponibles las 24 horas del da, los 7 das de la semana.   Si tiene un problema urgente y no puede comunicarse con nosotros, puede optar por buscar atencin mdica  en el consultorio de su doctor(a), en una clnica privada, en un centro de atencin urgente o en una sala de emergencias.  Si tiene una emergencia mdica, por favor llame inmediatamente al 911 o vaya a la sala de emergencias.  Nmeros de bper  - Dr. Kowalski: [phone]  - Dra. Moye: [phone]  - Dra. Stewart: [phone]  En caso de inclemencias del tiempo, por favor llame a nuestra lnea principal al [phone] para una actualizacin sobre el estado de cualquier retraso o cierre.  Consejos para la medicacin en dermatologa: Por favor, guarde las cajas en las que vienen los medicamentos de uso tpico para ayudarle a seguir las instrucciones sobre dnde y cmo usarlos. Las farmacias generalmente imprimen las instrucciones del medicamento slo en las cajas y no directamente en los tubos del medicamento.   Si su medicamento es muy caro, por favor, pngase en contacto con nuestra oficina llamando al [phone] y presione la opcin 4 o envenos un mensaje a travs de MyChart.   No podemos decirle cul ser su copago por los medicamentos por adelantado ya que esto es diferente dependiendo de la cobertura de su seguro. Sin embargo, es posible que podamos encontrar un medicamento sustituto a menor costo o llenar un formulario para que el  seguro cubra el medicamento que se considera necesario.   Si se requiere una autorizacin previa para que su compaa de seguros cubra su medicamento, por favor permtanos de 1 a 2 das hbiles para completar este proceso.  Los precios de los medicamentos varan con frecuencia dependiendo del lugar de dnde se surte la receta y alguna farmacias pueden ofrecer precios ms baratos.  El sitio web www.goodrx.com tiene cupones para medicamentos de diferentes farmacias. Los precios aqu no tienen en cuenta lo que podra costar con la ayuda del seguro (puede ser ms barato con su seguro), pero el sitio web puede darle el precio si no utiliz ningn seguro.  - Puede imprimir el cupn correspondiente y llevarlo con su receta a la farmacia.  - Tambin puede pasar por nuestra oficina durante el horario de atencin regular y recoger una tarjeta de cupones de GoodRx.  - Si necesita que su receta se enve electrnicamente a una farmacia diferente, informe a nuestra oficina a travs de MyChart de [place]   o por telfono llamando al [phone] y presione la opcin 4.  

## 2023-02-04 NOTE — Progress Notes (Signed)
Follow-Up Visit   Subjective  Lauren Wheeler is a 89 y.o. female who presents for the following: Actinic keratosis pt did use 5FU/Calcipotriene cream on the nose, and it did get red and peel. Her eczema has improved since starting the Clobetasol/CeraVe mix, and the Clobetasol solution has helped with her itchy scalp. Pt has noticed a new lesion on the R temple that she would like checked today.  The following portions of the chart were reviewed this encounter and updated as appropriate: medications, allergies, medical history  Review of Systems:  No other skin or systemic complaints except as noted in HPI or Assessment and Plan.  Objective  Well appearing patient in no apparent distress; mood and affect are within normal limits.  A focused examination was performed of the following areas: The face and scalp  Relevant exam findings are noted in the Assessment and Plan.   Assessment & Plan   ACTINIC KERATOSIS  Exam: Erythematous thin papules/macules with gritty scale  Actinic keratoses are precancerous spots that appear secondary to cumulative UV radiation exposure/sun exposure over time. They are chronic with expected duration over 1 year. A portion of actinic keratoses will progress to squamous cell carcinoma of the skin. It is not possible to reliably predict which spots will progress to skin cancer and so treatment is recommended to prevent development of skin cancer.  Recommend daily broad spectrum sunscreen SPF 30+ to sun-exposed areas, reapply every 2 hours as needed.  Recommend staying in the shade or wearing long sleeves, sun glasses (UVA+UVB protection) and wide brim hats (4-inch brim around the entire circumference of the hat). Call for new or changing lesions.  Treatment Plan:  Destruction Procedure Note Destruction method: cryotherapy   Informed consent: discussed and consent obtained   Lesion destroyed using liquid nitrogen: Yes   Outcome: patient tolerated  procedure well with no complications   Post-procedure details: wound care instructions given   Locations: R forehead x 1, R temple x 1, R cheek x 1, L nasal sidewall x 1, L forehead x 1 # of Lesions Treated: 5  Prior to procedure, discussed risks of blister formation, small wound, skin dyspigmentation, or rare scar following cryotherapy. Recommend Vaseline ointment to treated areas while healing.  SEBORRHEIC KERATOSIS - Stuck-on, waxy, tan-brown papules and/or plaques  - Benign-appearing - Discussed benign etiology and prognosis. - Observe - Call for any changes  ACTINIC DAMAGE - chronic, secondary to cumulative UV radiation exposure/sun exposure over time - diffuse scaly erythematous macules with underlying dyspigmentation - Recommend daily broad spectrum sunscreen SPF 30+ to sun-exposed areas, reapply every 2 hours as needed.  - Recommend staying in the shade or wearing long sleeves, sun glasses (UVA+UVB protection) and wide brim hats (4-inch brim around the entire circumference of the hat). - Call for new or changing lesions.  ATOPIC DERMATITIS Exam: rare scaly pink papules coalescing to plaques  Chronic condition with duration or expected duration over one year. Currently well-controlled.  Atopic dermatitis (eczema) is a chronic, relapsing, pruritic condition that can significantly affect quality of life. It is often associated with allergic rhinitis and/or asthma and can require treatment with topical medications, phototherapy, or in severe cases biologic injectable medication (Dupixent; Adbry) or Oral JAK inhibitors.  Treatment Plan: Continue moisturizer daily and Clobetasol/CeraVe mix QD PRN. Topical steroids (such as triamcinolone, fluocinolone, fluocinonide, mometasone, clobetasol, halobetasol, betamethasone, hydrocortisone) can cause thinning and lightening of the skin if they are used for too long in the same area. Your physician  has selected the right strength medicine for  your problem and area affected on the body. Please use your medication only as directed by your physician to prevent side effects.   SEBORRHEIC DERMATITIS Exam: Pink patches with greasy scale at scalp  Chronic condition with duration or expected duration over one year. Currently well-controlled.  Seborrheic Dermatitis is a chronic persistent rash characterized by pinkness and scaling most commonly of the mid face but also can occur on the scalp (dandruff), ears; mid chest, mid back and groin.  It tends to be exacerbated by stress and cooler weather.  People who have neurologic disease may experience new onset or exacerbation of existing seborrheic dermatitis.  The condition is not curable but treatable and can be controlled.  Treatment Plan:  Continue Clobetasol solution to aa's scalp QD PRN. Topical steroids (such as triamcinolone, fluocinolone, fluocinonide, mometasone, clobetasol, halobetasol, betamethasone, hydrocortisone) can cause thinning and lightening of the skin if they are used for too long in the same area. Your physician has selected the right strength medicine for your problem and area affected on the body. Please use your medication only as directed by your physician to prevent side effects.   Continue Ketoconazole 2% shampoo let sit 5-10 minutes then wash out. Use 3d/wk.   Recommend gentle skin care.  Return in about 1 year (around 02/04/2024) for [place] follow up .  Maylene Roes, CMA, am acting as scribe for Darden Dates, MD .   Documentation: I have reviewed the above documentation for accuracy and completeness, and I agree with the above.  Darden Dates, MD

## 2023-05-20 IMAGING — CR DG CHEST 2V
2 series · 2 of 2 positions shown · non-contrast
Comparison: Chest x-ray dated 11/26/2005.

CLINICAL DATA: Shortness of breath, new onset of atrial
fibrillation.

EXAM:
CHEST - 2 VIEW

[chest lat]
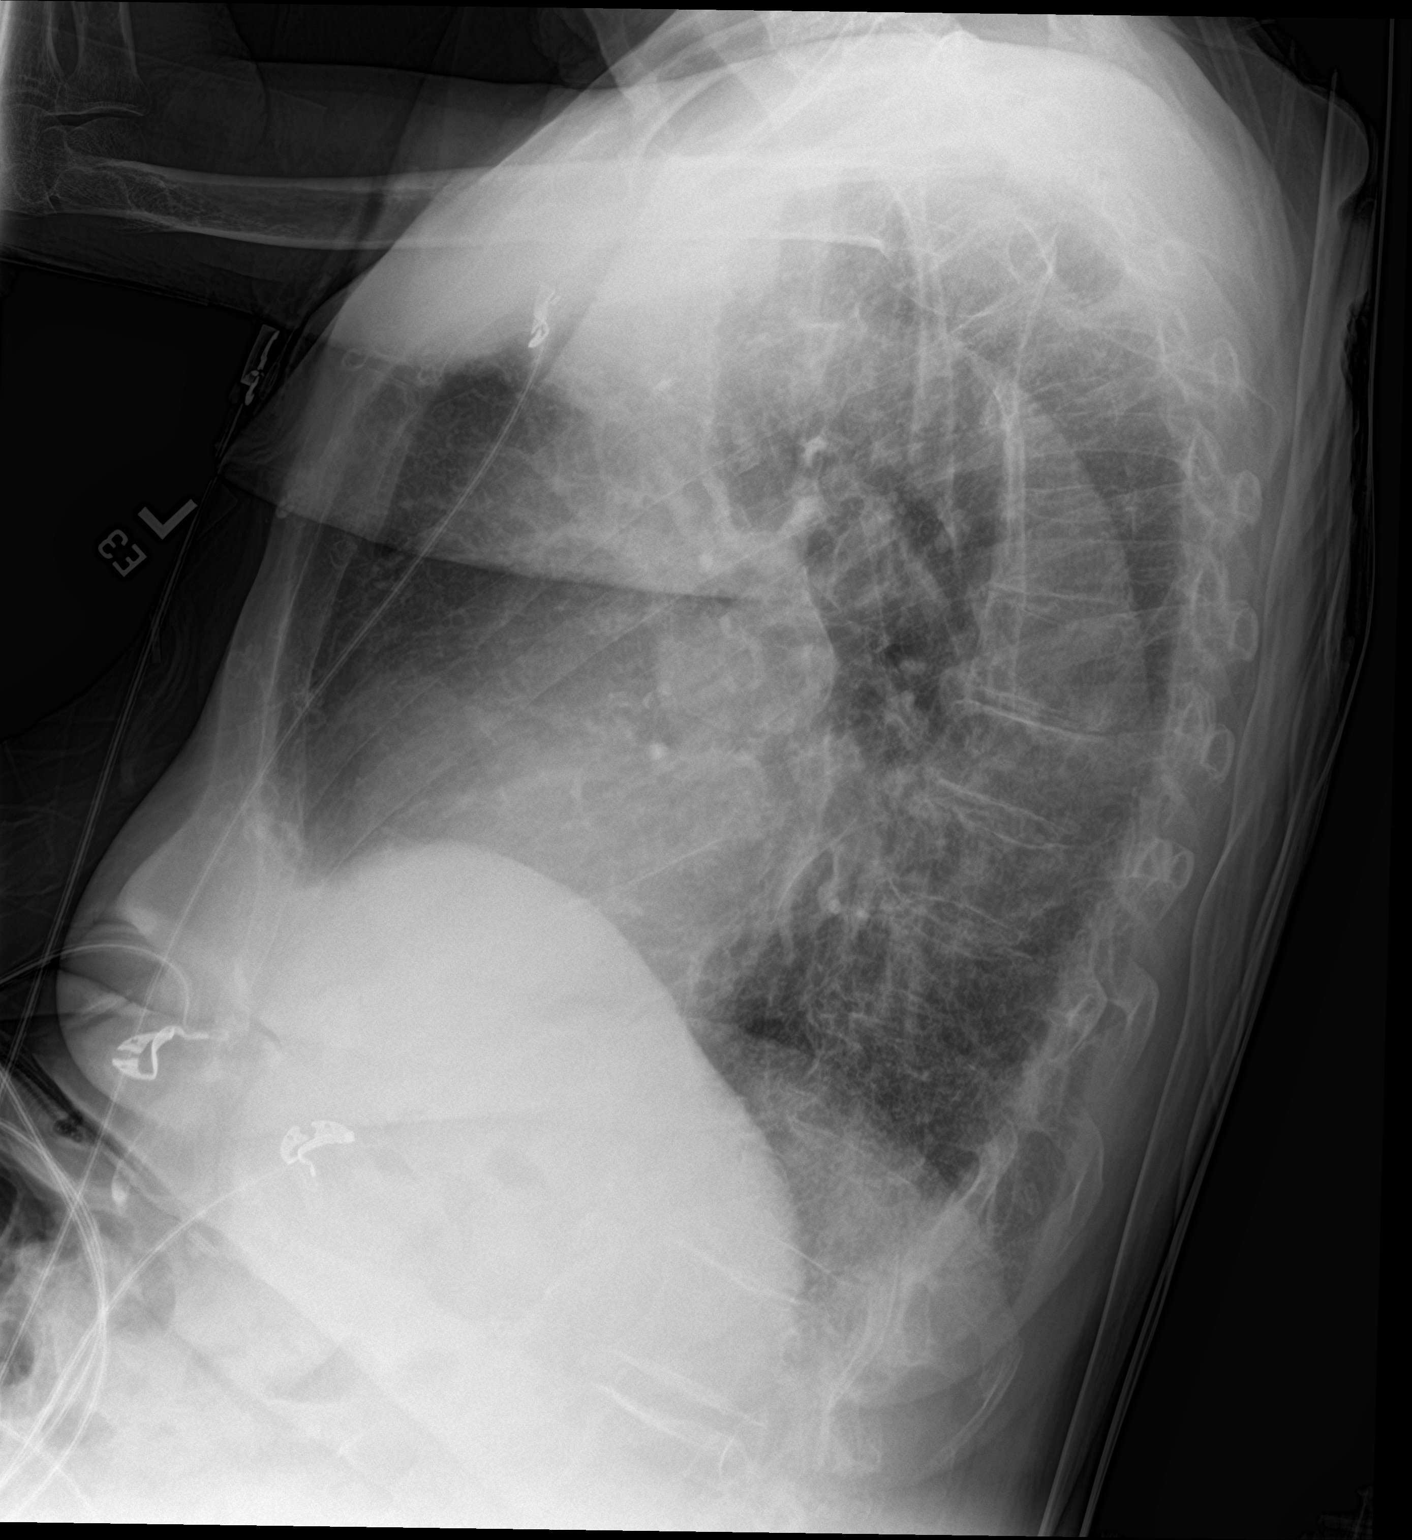

[chest ap]
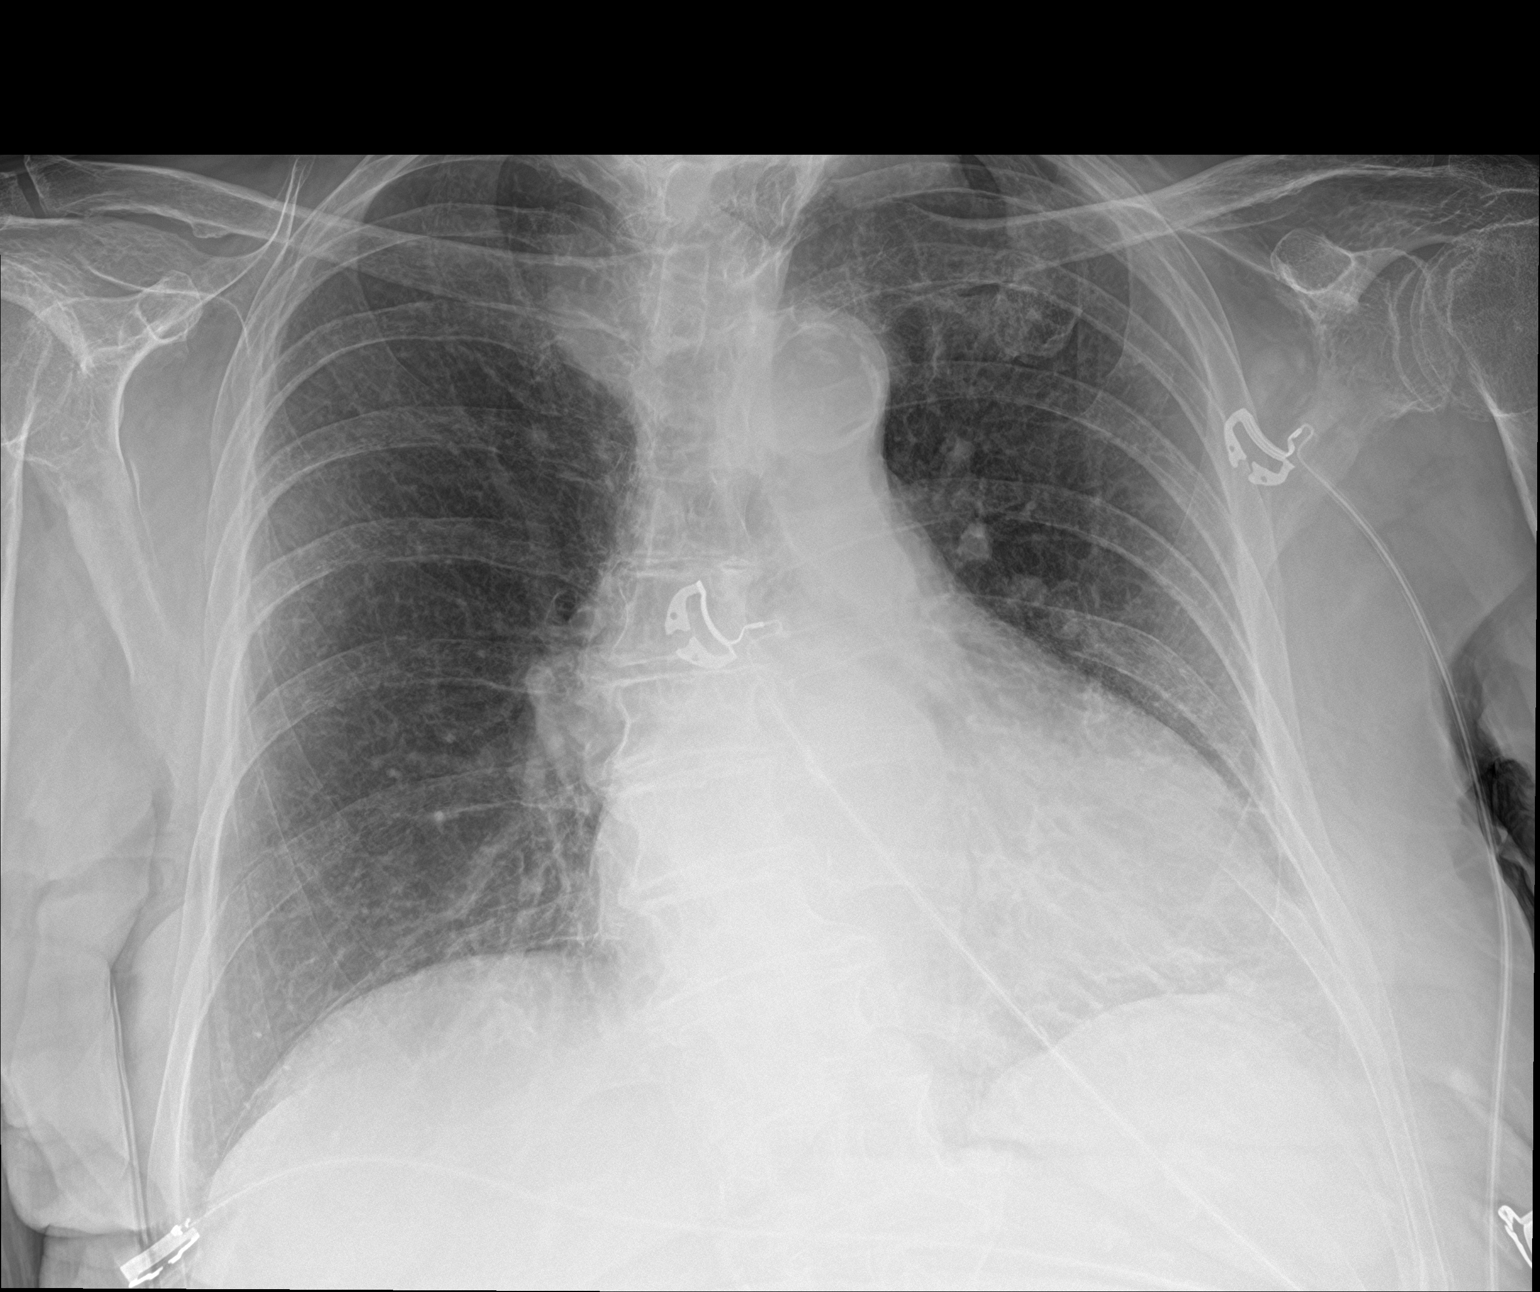

[2 of 2 positions shown; findings below may reference images not displayed]

FINDINGS: Borderline cardiomegaly. Coarse lung markings bilaterally. No
confluent opacity to suggest a developing pneumonia. No pleural
effusion or pneumothorax is seen. No acute-appearing osseous
abnormality, although characterization of osseous detail is limited
by diffuse osteopenia.
IMPRESSION: No active cardiopulmonary disease. No evidence of pneumonia or
pulmonary edema.

## 2024-01-25 ENCOUNTER — Other Ambulatory Visit: Payer: Self-pay | Admitting: Orthopedic Surgery

## 2024-01-25 ENCOUNTER — Ambulatory Visit
Admission: RE | Admit: 2024-01-25 | Discharge: 2024-01-25 | Disposition: A | Discharge status: Home / Self Care | Source: Ambulatory Visit | Attending: Orthopedic Surgery | Admitting: Orthopedic Surgery

## 2024-01-25 DIAGNOSIS — W19XXXA Unspecified fall, initial encounter: Secondary | ICD-10-CM | POA: Insufficient documentation

## 2024-01-25 DIAGNOSIS — M1611 Unilateral primary osteoarthritis, right hip: Secondary | ICD-10-CM

## 2024-01-25 DIAGNOSIS — M25551 Pain in right hip: Secondary | ICD-10-CM | POA: Insufficient documentation

## 2024-01-25 DIAGNOSIS — M51369 Other intervertebral disc degeneration, lumbar region without mention of lumbar back pain or lower extremity pain: Secondary | ICD-10-CM | POA: Insufficient documentation

## 2024-01-25 DIAGNOSIS — R609 Edema, unspecified: Secondary | ICD-10-CM | POA: Diagnosis not present

## 2024-01-25 DIAGNOSIS — M47816 Spondylosis without myelopathy or radiculopathy, lumbar region: Secondary | ICD-10-CM | POA: Diagnosis not present

## 2024-02-08 ENCOUNTER — Ambulatory Visit: Payer: Medicare PPO | Admitting: Dermatology

## 2024-02-08 ENCOUNTER — Encounter: Payer: Self-pay | Admitting: Dermatology

## 2024-02-08 DIAGNOSIS — S0003XD Contusion of scalp, subsequent encounter: Secondary | ICD-10-CM

## 2024-02-08 DIAGNOSIS — L820 Inflamed seborrheic keratosis: Secondary | ICD-10-CM | POA: Diagnosis not present

## 2024-02-08 DIAGNOSIS — Z79899 Other long term (current) drug therapy: Secondary | ICD-10-CM

## 2024-02-08 DIAGNOSIS — L219 Seborrheic dermatitis, unspecified: Secondary | ICD-10-CM | POA: Diagnosis not present

## 2024-02-08 DIAGNOSIS — L2081 Atopic neurodermatitis: Secondary | ICD-10-CM

## 2024-02-08 DIAGNOSIS — L821 Other seborrheic keratosis: Secondary | ICD-10-CM

## 2024-02-08 DIAGNOSIS — Z872 Personal history of diseases of the skin and subcutaneous tissue: Secondary | ICD-10-CM

## 2024-02-08 DIAGNOSIS — T148XXA Other injury of unspecified body region, initial encounter: Secondary | ICD-10-CM

## 2024-02-08 DIAGNOSIS — L209 Atopic dermatitis, unspecified: Secondary | ICD-10-CM | POA: Diagnosis not present

## 2024-02-08 MED ORDER — KETOCONAZOLE 2 % EX SHAM: MEDICATED_SHAMPOO | CUTANEOUS | 11 refills | Status: AC

## 2024-02-08 MED ORDER — CLOBETASOL PROPIONATE 0.05 % EX SOLN: CUTANEOUS | 2 refills | Status: AC

## 2024-02-08 NOTE — Patient Instructions (Addendum)
 Eczema Skin Care  Buy TWO 16oz jars of CeraVe moisturizing cream  [facility], [facility], [facility] (no prescription needed)  Costs about $15 per jar   Jar #1: Use as a moisturizer as needed. Can be applied to any area of the body. Use twice daily to unaffected areas.  Jar #2: Pour one 50ml bottle of clobetasol 0.05% solution into jar, mix well. Label this jar to indicate the medication has been added. Use twice daily to affected areas. Do not apply to face, groin or underarms.  Moisturizer may burn or sting initially. Try for at least 4 weeks.    Alternate Ketoconazole 2% shampoo with T-Sal Shampoo let shampoo sit on scalp 5 minutes  Cryotherapy Aftercare  Wash gently with soap and water everyday.   Apply Vaseline and Band-Aid daily until healed.    Due to recent changes in healthcare laws, you may see results of your pathology and/or laboratory studies on MyChart before the doctors have had a chance to review them. We understand that in some cases there may be results that are confusing or concerning to you. Please understand that not all results are received at the same time and often the doctors may need to interpret multiple results in order to provide you with the best plan of care or course of treatment. Therefore, we ask that you please give [place] 2 business days to thoroughly review all your results before contacting the office for clarification. Should we see a critical lab result, you will be contacted sooner.   If You Need Anything After Your Visit  If you have any questions or concerns for your doctor, please call our main line at [phone] and press option 4 to reach your doctor's medical assistant. If no one answers, please leave a voicemail as directed and we will return your call as soon as possible. Messages left after 4 pm will be answered the following business day.   You may also send [place] a message via MyChart. We typically respond to MyChart messages within 1-2 business  days.  For prescription refills, please ask your pharmacy to contact our office. Our fax number is [phone].  If you have an urgent issue when the clinic is closed that cannot wait until the next business day, you can page your doctor at the number below.    Please note that while we do our best to be available for urgent issues outside of office hours, we are not available 24/7.   If you have an urgent issue and are unable to reach [place], you may choose to seek medical care at your doctor's office, retail clinic, urgent care center, or emergency room.  If you have a medical emergency, please immediately call 911 or go to the emergency department.  Pager Numbers  - Dr. Gwen Pounds: [phone]  - Dr. Roseanne Reno: [phone]  - Dr. Katrinka Blazing: [phone]   In the event of inclement weather, please call our main line at [phone] for an update on the status of any delays or closures.  Dermatology Medication Tips: Please keep the boxes that topical medications come in in order to help keep track of the instructions about where and how to use these. Pharmacies typically print the medication instructions only on the boxes and not directly on the medication tubes.   If your medication is too expensive, please contact our office at [phone] option 4 or send [place] a message through MyChart.   We are unable to tell what your co-pay for medications will be  in advance as this is different depending on your insurance coverage. However, we may be able to find a substitute medication at lower cost or fill out paperwork to get insurance to cover a needed medication.   If a prior authorization is required to get your medication covered by your insurance company, please allow [place] 1-2 business days to complete this process.  Drug prices often vary depending on where the prescription is filled and some pharmacies may offer cheaper prices.  The website www.goodrx.com contains coupons for medications  through different pharmacies. The prices here do not account for what the cost may be with help from insurance (it may be cheaper with your insurance), but the website can give you the price if you did not use any insurance.  - You can print the associated coupon and take it with your prescription to the pharmacy.  - You may also stop by our office during regular business hours and pick up a GoodRx coupon card.  - If you need your prescription sent electronically to a different pharmacy, notify our office through [facility] or by phone at [phone] option 4.     Si Usted Necesita Algo Despus de Su Visita  Tambin puede enviarnos un mensaje a travs de Clinical cytogeneticist. Por lo general respondemos a los mensajes de MyChart en el transcurso de 1 a 2 das hbiles.  Para renovar recetas, por favor pida a su farmacia que se ponga en contacto con nuestra oficina. Annie Sable de fax es [place] [phone].  Si tiene un asunto urgente cuando la clnica est cerrada y que no puede esperar hasta el siguiente da hbil, puede llamar/localizar a su doctor(a) al nmero que aparece a continuacin.   Por favor, tenga en cuenta que aunque hacemos todo lo posible para estar disponibles para asuntos urgentes fuera del horario de [place], no estamos disponibles las 24 horas del da, los 7 [address]  [address] de la [place].   Si tiene un problema urgente y no puede comunicarse con nosotros, puede optar por buscar atencin mdica  en el consultorio de su doctor(a), en una clnica privada, en un centro de atencin urgente o en una sala de emergencias.  Si tiene Engineer, drilling, por favor llame inmediatamente al 911 o vaya a la sala de emergencias.  Nmeros de bper  - Dr. Gwen Pounds: [phone]  - Dra. Roseanne Reno: [phone]  - Dr. Katrinka Blazing: [phone]   En caso de inclemencias del tiempo, por favor llame a Lacy Duverney principal al [phone] para una actualizacin sobre el [place] de cualquier retraso o  cierre.  Consejos para la medicacin en dermatologa: Por favor, guarde las cajas en las que vienen los medicamentos de uso tpico para ayudarle a seguir las instrucciones sobre dnde y cmo usarlos. Las farmacias generalmente imprimen las instrucciones del medicamento slo en las cajas y no directamente en los tubos del [place].   Si su medicamento es muy caro, por favor, pngase en contacto con Rolm Gala llamando al [phone] y presione la opcin 4 o envenos un mensaje a travs de Clinical cytogeneticist.   No podemos decirle cul ser su copago por los medicamentos por adelantado ya que esto es diferente dependiendo de la cobertura de su seguro. Sin embargo, es posible que podamos encontrar un medicamento sustituto a Audiological scientist un formulario para que el seguro cubra el medicamento que se considera necesario.   Si se requiere una autorizacin previa para que su compaa de seguros [place] su medicamento, por favor permtanos de 1  a 2 das hbiles para completar este proceso.  Los precios de los medicamentos varan con frecuencia dependiendo del Environmental consultant de dnde se surte la receta y alguna farmacias pueden ofrecer precios ms baratos.  El sitio web www.goodrx.com tiene cupones para medicamentos de Health and safety inspector. Los precios aqu no tienen en cuenta lo que podra costar con la ayuda del seguro (puede ser ms barato con su seguro), pero el sitio web puede darle el precio si no utiliz Tourist information centre manager.  - Puede imprimir el cupn correspondiente y llevarlo con su receta a la farmacia.  - Tambin puede pasar por nuestra oficina durante el horario de atencin regular y Education officer, museum una tarjeta de cupones de GoodRx.  - Si necesita que su receta se enve electrnicamente a una farmacia diferente, informe a nuestra oficina a travs de MyChart de [place] o por telfono llamando al [phone] y presione la opcin 4.

## 2024-02-08 NOTE — Progress Notes (Signed)
 Follow-Up Visit   Subjective  Lauren Wheeler is a 89 y.o. female who presents for the following: hx of Aks, fac 1 yr f/u, check spot R leg ~2wks, itchy, hx of Atopic derm back, arms, clobetasol/cerave mix ~qod, Seb Derm Scalp, Ketoconazole 2% shampoo, everything is well-controlled with treatment. The patient has spots, moles and lesions to be evaluated, some may be new or changing and the patient may have concern these could be cancer.  Patient accompanied by daughters who contribute to history.  The following portions of the chart were reviewed this encounter and updated as appropriate: medications, allergies, medical history  Review of Systems:  No other skin or systemic complaints except as noted in HPI or Assessment and Plan.  Objective  Well appearing patient in no apparent distress; mood and affect are within normal limits.   A focused examination was performed of the following areas: Face, right leg, scalp, back  Relevant exam findings are noted in the Assessment and Plan.  R calf x 1, R temporal hairline x 1, Vertex scalp x 1 (3) Waxy scaly pink macule/papules  Assessment & Plan   ATOPIC DERMATITIS back Exam: xerosis lower back, otherwise clear <1% BSA  Chronic condition with duration or expected duration over one year. Currently well-controlled.  Atopic dermatitis (eczema) is a chronic, relapsing, pruritic condition that can significantly affect quality of life. It is often associated with allergic rhinitis and/or asthma and can require treatment with topical medications, phototherapy, or in severe cases biologic injectable medication (Dupixent; Adbry) or Oral JAK inhibitors.  Treatment Plan: Cont Clobetasol/Cerave mix qd prn flares, avoid f/g/a  Topical steroids (such as triamcinolone, fluocinolone, fluocinonide, mometasone, clobetasol, halobetasol, betamethasone, hydrocortisone) can cause thinning and lightening of the skin if they are used for too long in the  same area. Your physician has selected the right strength medicine for your problem and area affected on the body. Please use your medication only as directed by your physician to prevent side effects.   Recommend gentle skin care.   SEBORRHEIC DERMATITIS Scalp,  Exam: scaling crown scalp  Chronic and persistent condition with duration or expected duration over one year. Condition is improving with treatment but not currently at goal.   Seborrheic Dermatitis is a chronic persistent rash characterized by pinkness and scaling most commonly of the mid face but also can occur on the scalp (dandruff), ears; mid chest, mid back and groin.  It tends to be exacerbated by stress and cooler weather.  People who have neurologic disease may experience new onset or exacerbation of existing seborrheic dermatitis.  The condition is not curable but treatable and can be controlled.  Treatment Plan: Cont Ketoconazole 2% shampoo qo shampoo, let sit 5 minutes and rinse out   Start T-Sal shampoo qo shampoo alternating with Ketoconazole 2% shampoo  Long term medication management.  Patient is using long term (months to years) prescription medication  to control their dermatologic condition.  These medications require periodic monitoring to evaluate for efficacy and side effects and may require periodic laboratory monitoring.   SEBORRHEIC KERATOSIS Back, face - Stuck-on, waxy, tan-brown papules and/or plaques  - Benign-appearing - Discussed benign etiology and prognosis. - Observe - Call for any changes  RESOLVING HEMATOMA R frontal hairline Exam: black crusted plaque R frontal hairline, recent h/o trauma, was seen by doctor at that time and diagnose with hematoma.  Treatment Plan: Recommend vaseline qd, RTC if doesn't heal completely  HISTORY OF PRECANCEROUS ACTINIC KERATOSIS - site(s) of  PreCancerous Actinic Keratosis clear today. - these may recur and new lesions may form requiring treatment to  prevent transformation into skin cancer - observe for new or changing spots and contact [facility] for appointment if occur - photoprotection with sun protective clothing; sunglasses and broad spectrum sunscreen with SPF of at least 30 + and frequent self skin exams recommended - yearly exams by a dermatologist recommended for persons with history of PreCancerous Actinic Keratoses  INFLAMED SEBORRHEIC KERATOSIS (3) R calf x 1, R temporal hairline x 1, Vertex scalp x 1 (3) Symptomatic, irritating, patient would like treated. Destruction of lesion - R calf x 1, R temporal hairline x 1, Vertex scalp x 1 (3)  Destruction method: cryotherapy   Informed consent: discussed and consent obtained   Lesion destroyed using liquid nitrogen: Yes   Region frozen until ice ball extended beyond lesion: Yes   Outcome: patient tolerated procedure well with no complications   Post-procedure details: wound care instructions given   Additional details:  Prior to procedure, discussed risks of blister formation, small wound, skin dyspigmentation, or rare scar following cryotherapy. Recommend Vaseline ointment to treated areas while healing.  SEBORRHEIC DERMATITIS   Related Medications clobetasol (TEMOVATE) 0.05 % external solution Mix in 1 tub of Cerave cream, apply to eczema on body qd/ bid prn flares, avoid face, groin, axilla ketoconazole (NIZORAL) 2 % shampoo 2-3 times per week wash scalp, let sit 5 minutes and rinse out  Return in about 1 year (around 02/07/2025) for Hx of AKs, Atopic Derm, Seb Derm.  I, Rollie Clipper, RMA, am acting as scribe for Artemio Larry, MD .   Documentation: I have reviewed the above documentation for accuracy and completeness, and I agree with the above.  Artemio Larry, MD

## 2025-02-13 ENCOUNTER — Ambulatory Visit: Admitting: Dermatology
